# Patient Record
Sex: Male | Born: 1980 | Hispanic: No | Marital: Single | Smoking: Current every day smoker
Health system: Southern US, Community
[De-identification: ages and names within clinical notes are randomized; demographics above are authoritative.]

## PROBLEM LIST (undated history)

## (undated) DIAGNOSIS — S82872A Displaced pilon fracture of left tibia, initial encounter for closed fracture: Secondary | ICD-10-CM

## (undated) DIAGNOSIS — M1990 Unspecified osteoarthritis, unspecified site: Secondary | ICD-10-CM

## (undated) DIAGNOSIS — S82409A Unspecified fracture of shaft of unspecified fibula, initial encounter for closed fracture: Secondary | ICD-10-CM

## (undated) HISTORY — PX: KNEE DISLOCATION SURGERY: SHX689

## (undated) HISTORY — PX: ORIF SHOULDER FRACTURE: SHX5035

## (undated) HISTORY — PX: BLADDER SURGERY: SHX569

---

## 2001-01-20 ENCOUNTER — Emergency Department (HOSPITAL_COMMUNITY): Admission: EM | Admit: 2001-01-20 | Discharge: 2001-01-20 | Payer: Self-pay | Admitting: Emergency Medicine

## 2001-02-07 ENCOUNTER — Emergency Department (HOSPITAL_COMMUNITY): Admission: EM | Admit: 2001-02-07 | Discharge: 2001-02-07 | Payer: Self-pay | Admitting: Emergency Medicine

## 2003-09-08 ENCOUNTER — Inpatient Hospital Stay (HOSPITAL_COMMUNITY): Admission: AC | Admit: 2003-09-08 | Discharge: 2003-09-21 | Payer: Self-pay

## 2003-09-21 ENCOUNTER — Inpatient Hospital Stay (HOSPITAL_COMMUNITY)
Admission: RE | Admit: 2003-09-21 | Discharge: 2003-09-28 | Payer: Self-pay | Admitting: Physical Medicine & Rehabilitation

## 2003-10-24 ENCOUNTER — Ambulatory Visit (HOSPITAL_COMMUNITY): Admission: RE | Admit: 2003-10-24 | Discharge: 2003-10-24 | Payer: Self-pay | Admitting: Specialist

## 2003-11-01 ENCOUNTER — Encounter: Admission: RE | Admit: 2003-11-01 | Discharge: 2004-01-30 | Payer: Self-pay | Admitting: *Deleted

## 2003-11-08 ENCOUNTER — Encounter
Admission: RE | Admit: 2003-11-08 | Discharge: 2004-02-06 | Payer: Self-pay | Admitting: Physical Medicine & Rehabilitation
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
## 2006-06-22 ENCOUNTER — Emergency Department (HOSPITAL_COMMUNITY): Admission: EM | Admit: 2006-06-22 | Discharge: 2006-06-23 | Payer: Self-pay | Admitting: Emergency Medicine

## 2016-04-02 DIAGNOSIS — S82409A Unspecified fracture of shaft of unspecified fibula, initial encounter for closed fracture: Secondary | ICD-10-CM

## 2016-04-02 DIAGNOSIS — S82872A Displaced pilon fracture of left tibia, initial encounter for closed fracture: Secondary | ICD-10-CM

## 2016-04-02 HISTORY — DX: Unspecified fracture of shaft of unspecified fibula, initial encounter for closed fracture: S82.409A

## 2016-04-02 HISTORY — DX: Displaced pilon fracture of left tibia, initial encounter for closed fracture: S82.872A

## 2016-04-30 HISTORY — PX: EXTERNAL FIXATOR APPLICATION: SHX1554

## 2016-05-08 ENCOUNTER — Encounter (HOSPITAL_BASED_OUTPATIENT_CLINIC_OR_DEPARTMENT_OTHER): Payer: Self-pay | Admitting: *Deleted

## 2016-05-08 ENCOUNTER — Other Ambulatory Visit: Payer: Self-pay | Admitting: Orthopedic Surgery

## 2016-05-11 ENCOUNTER — Encounter (HOSPITAL_BASED_OUTPATIENT_CLINIC_OR_DEPARTMENT_OTHER): Payer: Self-pay | Admitting: *Deleted

## 2016-05-14 ENCOUNTER — Ambulatory Visit (HOSPITAL_BASED_OUTPATIENT_CLINIC_OR_DEPARTMENT_OTHER): Payer: No Typology Code available for payment source | Admitting: Certified Registered"

## 2016-05-14 ENCOUNTER — Ambulatory Visit (HOSPITAL_BASED_OUTPATIENT_CLINIC_OR_DEPARTMENT_OTHER)
Admission: RE | Admit: 2016-05-14 | Discharge: 2016-05-14 | Disposition: A | Discharge status: Home / Self Care | Payer: No Typology Code available for payment source | Source: Ambulatory Visit | Attending: Orthopedic Surgery | Admitting: Orthopedic Surgery

## 2016-05-14 ENCOUNTER — Encounter (HOSPITAL_BASED_OUTPATIENT_CLINIC_OR_DEPARTMENT_OTHER): Payer: Self-pay | Admitting: *Deleted

## 2016-05-14 ENCOUNTER — Encounter (HOSPITAL_BASED_OUTPATIENT_CLINIC_OR_DEPARTMENT_OTHER)
Admission: RE | Disposition: A | Discharge status: Home / Self Care | Payer: Self-pay | Source: Ambulatory Visit | Attending: Orthopedic Surgery

## 2016-05-14 DIAGNOSIS — X58XXXA Exposure to other specified factors, initial encounter: Secondary | ICD-10-CM | POA: Insufficient documentation

## 2016-05-14 DIAGNOSIS — S82402A Unspecified fracture of shaft of left fibula, initial encounter for closed fracture: Secondary | ICD-10-CM | POA: Insufficient documentation

## 2016-05-14 DIAGNOSIS — S82872A Displaced pilon fracture of left tibia, initial encounter for closed fracture: Secondary | ICD-10-CM | POA: Insufficient documentation

## 2016-05-14 HISTORY — DX: Unspecified fracture of shaft of unspecified fibula, initial encounter for closed fracture: S82.409A

## 2016-05-14 HISTORY — PX: OPEN REDUCTION INTERNAL FIXATION (ORIF) TIBIA/FIBULA FRACTURE: SHX5992

## 2016-05-14 HISTORY — PX: EXTERNAL FIXATION REMOVAL: SHX5040

## 2016-05-14 HISTORY — DX: Displaced pilon fracture of left tibia, initial encounter for closed fracture: S82.872A

## 2016-05-14 SURGERY — REMOVAL, EXTERNAL FIXATION DEVICE, LOWER EXTREMITY
Anesthesia: Regional | Site: Leg Lower | Laterality: Left

## 2016-05-14 MED ORDER — LACTATED RINGERS IV SOLN
INTRAVENOUS | Status: DC
  Administered 2016-05-14 (×2): via INTRAVENOUS

## 2016-05-14 MED ORDER — FENTANYL CITRATE (PF) 100 MCG/2ML IJ SOLN
50.0000 ug | INTRAMUSCULAR | Status: DC | PRN
  Administered 2016-05-14: 100 ug via INTRAVENOUS

## 2016-05-14 MED ORDER — SCOPOLAMINE 1 MG/3DAYS TD PT72: 1.0000 | MEDICATED_PATCH | Freq: Once | TRANSDERMAL | Status: DC | PRN

## 2016-05-14 MED ORDER — MIDAZOLAM HCL 2 MG/2ML IJ SOLN
INTRAMUSCULAR | Status: AC
  Filled 2016-05-14: qty 2

## 2016-05-14 MED ORDER — PROPOFOL 500 MG/50ML IV EMUL
INTRAVENOUS | Status: AC
  Filled 2016-05-14: qty 50

## 2016-05-14 MED ORDER — ONDANSETRON HCL 4 MG/2ML IJ SOLN: 4.0000 mg | Freq: Four times a day (QID) | INTRAMUSCULAR | Status: DC | PRN

## 2016-05-14 MED ORDER — CEFAZOLIN SODIUM-DEXTROSE 2-4 GM/100ML-% IV SOLN
2.0000 g | INTRAVENOUS | Status: AC
  Administered 2016-05-14: 2 g via INTRAVENOUS

## 2016-05-14 MED ORDER — 0.9 % SODIUM CHLORIDE (POUR BTL) OPTIME
TOPICAL | Status: DC | PRN
  Administered 2016-05-14: 300 mL

## 2016-05-14 MED ORDER — GLYCOPYRROLATE 0.2 MG/ML IJ SOLN: 0.2000 mg | Freq: Once | INTRAMUSCULAR | Status: DC | PRN

## 2016-05-14 MED ORDER — HYDROMORPHONE HCL 1 MG/ML IJ SOLN: 0.2500 mg | INTRAMUSCULAR | Status: DC | PRN

## 2016-05-14 MED ORDER — CEFAZOLIN SODIUM-DEXTROSE 2-4 GM/100ML-% IV SOLN
INTRAVENOUS | Status: AC
  Filled 2016-05-14: qty 100

## 2016-05-14 MED ORDER — ONDANSETRON HCL 4 MG/2ML IJ SOLN
INTRAMUSCULAR | Status: AC
  Filled 2016-05-14: qty 2

## 2016-05-14 MED ORDER — MIDAZOLAM HCL 2 MG/2ML IJ SOLN
1.0000 mg | INTRAMUSCULAR | Status: DC | PRN
  Administered 2016-05-14 (×2): 2 mg via INTRAVENOUS

## 2016-05-14 MED ORDER — ONDANSETRON HCL 4 MG/2ML IJ SOLN
INTRAMUSCULAR | Status: DC | PRN
  Administered 2016-05-14: 4 mg via INTRAVENOUS

## 2016-05-14 MED ORDER — BUPIVACAINE-EPINEPHRINE (PF) 0.5% -1:200000 IJ SOLN
INTRAMUSCULAR | Status: DC | PRN
  Administered 2016-05-14 (×2): 20 mL via PERINEURAL

## 2016-05-14 MED ORDER — FENTANYL CITRATE (PF) 100 MCG/2ML IJ SOLN
INTRAMUSCULAR | Status: AC
  Filled 2016-05-14: qty 2

## 2016-05-14 MED ORDER — LIDOCAINE 2% (20 MG/ML) 5 ML SYRINGE
INTRAMUSCULAR | Status: AC
  Filled 2016-05-14: qty 5

## 2016-05-14 MED ORDER — NAPROXEN SODIUM 220 MG PO TABS: 440.0000 mg | ORAL_TABLET | Freq: Two times a day (BID) | ORAL | Status: DC

## 2016-05-14 MED ORDER — PROPOFOL 10 MG/ML IV BOLUS
INTRAVENOUS | Status: DC | PRN
  Administered 2016-05-14: 50 mg via INTRAVENOUS
  Administered 2016-05-14: 200 mg via INTRAVENOUS
  Administered 2016-05-14: 30 mg via INTRAVENOUS

## 2016-05-14 MED ORDER — OXYCODONE HCL 5 MG/5ML PO SOLN: 5.0000 mg | Freq: Once | ORAL | Status: DC | PRN

## 2016-05-14 MED ORDER — CHLORHEXIDINE GLUCONATE 4 % EX LIQD: 60.0000 mL | Freq: Once | CUTANEOUS | Status: DC

## 2016-05-14 MED ORDER — HYDROMORPHONE HCL 2 MG PO TABS: 2.0000 mg | ORAL_TABLET | ORAL | Status: DC | PRN

## 2016-05-14 MED ORDER — DEXAMETHASONE SODIUM PHOSPHATE 10 MG/ML IJ SOLN
INTRAMUSCULAR | Status: DC | PRN
  Administered 2016-05-14: 10 mg via INTRAVENOUS

## 2016-05-14 MED ORDER — OXYCODONE HCL 5 MG PO TABS: 5.0000 mg | ORAL_TABLET | Freq: Once | ORAL | Status: DC | PRN

## 2016-05-14 MED ORDER — DEXAMETHASONE SODIUM PHOSPHATE 10 MG/ML IJ SOLN
INTRAMUSCULAR | Status: AC
  Filled 2016-05-14: qty 1

## 2016-05-14 MED ORDER — SODIUM CHLORIDE 0.9 % IV SOLN: INTRAVENOUS | Status: DC

## 2016-05-14 SURGICAL SUPPLY — 87 items
BAG DECANTER FOR FLEXI CONT (MISCELLANEOUS) IMPLANT
BANDAGE ESMARK 6X9 LF (GAUZE/BANDAGES/DRESSINGS) ×1 IMPLANT
BIT DRILL 2.5X2.75 QC CALB (BIT) ×2 IMPLANT
BIT DRILL 3.5X5.5 QC CALB (BIT) ×2 IMPLANT
BIT DRILL CALIBRATED 2.7 (BIT) ×2 IMPLANT
BLADE SURG 15 STRL LF DISP TIS (BLADE) ×4 IMPLANT
BLADE SURG 15 STRL SS (BLADE) ×4
BNDG COHESIVE 4X5 TAN STRL (GAUZE/BANDAGES/DRESSINGS) ×2 IMPLANT
BNDG COHESIVE 6X5 TAN STRL LF (GAUZE/BANDAGES/DRESSINGS) ×2 IMPLANT
BNDG ESMARK 6X9 LF (GAUZE/BANDAGES/DRESSINGS) ×2
BRUSH SCRUB EZ PLAIN DRY (MISCELLANEOUS) ×2 IMPLANT
CHLORAPREP W/TINT 26ML (MISCELLANEOUS) ×2 IMPLANT
COVER BACK TABLE 60X90IN (DRAPES) ×2 IMPLANT
CUFF TOURNIQUET SINGLE 34IN LL (TOURNIQUET CUFF) ×2 IMPLANT
DECANTER SPIKE VIAL GLASS SM (MISCELLANEOUS) IMPLANT
DRAPE EXTREMITY T 121X128X90 (DRAPE) ×2 IMPLANT
DRAPE OEC MINIVIEW 54X84 (DRAPES) ×2 IMPLANT
DRAPE U-SHAPE 47X51 STRL (DRAPES) ×2 IMPLANT
DRILL SLEEVE 2.7 DIST TIB (TRAUMA) ×1
DRSG MEPITEL 4X7.2 (GAUZE/BANDAGES/DRESSINGS) ×2 IMPLANT
DRSG PAD ABDOMINAL 8X10 ST (GAUZE/BANDAGES/DRESSINGS) ×4 IMPLANT
ELECT REM PT RETURN 9FT ADLT (ELECTROSURGICAL) ×2
ELECTRODE REM PT RTRN 9FT ADLT (ELECTROSURGICAL) ×1 IMPLANT
GAUZE SPONGE 4X4 12PLY STRL (GAUZE/BANDAGES/DRESSINGS) ×2 IMPLANT
GAUZE XEROFORM 1X8 LF (GAUZE/BANDAGES/DRESSINGS) IMPLANT
GLOVE BIO SURGEON STRL SZ8 (GLOVE) ×2 IMPLANT
GLOVE BIOGEL PI IND STRL 7.0 (GLOVE) ×2 IMPLANT
GLOVE BIOGEL PI IND STRL 8 (GLOVE) ×2 IMPLANT
GLOVE BIOGEL PI INDICATOR 7.0 (GLOVE) ×2
GLOVE BIOGEL PI INDICATOR 8 (GLOVE) ×2
GLOVE ECLIPSE 6.5 STRL STRAW (GLOVE) ×2 IMPLANT
GLOVE ECLIPSE 7.5 STRL STRAW (GLOVE) ×2 IMPLANT
GLOVE EXAM NITRILE MD LF STRL (GLOVE) IMPLANT
GOWN STRL REUS W/ TWL LRG LVL3 (GOWN DISPOSABLE) ×1 IMPLANT
GOWN STRL REUS W/ TWL XL LVL3 (GOWN DISPOSABLE) ×2 IMPLANT
GOWN STRL REUS W/TWL LRG LVL3 (GOWN DISPOSABLE) ×1
GOWN STRL REUS W/TWL XL LVL3 (GOWN DISPOSABLE) ×2
K-WIRE ACE 1.6X6 (WIRE) ×4
KWIRE ACE 1.6X6 (WIRE) ×2 IMPLANT
NEEDLE HYPO 22GX1.5 SAFETY (NEEDLE) ×2 IMPLANT
NS IRRIG 1000ML POUR BTL (IV SOLUTION) ×2 IMPLANT
PACK BASIN DAY SURGERY FS (CUSTOM PROCEDURE TRAY) ×2 IMPLANT
PAD CAST 4YDX4 CTTN HI CHSV (CAST SUPPLIES) ×1 IMPLANT
PADDING CAST ABS 4INX4YD NS (CAST SUPPLIES)
PADDING CAST ABS COTTON 4X4 ST (CAST SUPPLIES) IMPLANT
PADDING CAST COTTON 4X4 STRL (CAST SUPPLIES) ×1
PADDING CAST COTTON 6X4 STRL (CAST SUPPLIES) ×2 IMPLANT
PENCIL BUTTON HOLSTER BLD 10FT (ELECTRODE) ×2 IMPLANT
PLATE 9H 184 LT MED DIST TIB (Plate) ×2 IMPLANT
PLATE ACE 100DEG 7HOLE (Plate) ×2 IMPLANT
SANITIZER HAND PURELL 535ML FO (MISCELLANEOUS) ×2 IMPLANT
SCREW CORT FT 32X3.5XNONLOCK (Screw) ×2 IMPLANT
SCREW CORTICAL 3.5MM  16MM (Screw) ×2 IMPLANT
SCREW CORTICAL 3.5MM  30MM (Screw) ×3 IMPLANT
SCREW CORTICAL 3.5MM  32MM (Screw) ×2 IMPLANT
SCREW CORTICAL 3.5MM 14MM (Screw) ×8 IMPLANT
SCREW CORTICAL 3.5MM 16MM (Screw) ×2 IMPLANT
SCREW CORTICAL 3.5MM 18MM (Screw) ×2 IMPLANT
SCREW CORTICAL 3.5MM 30MM (Screw) ×3 IMPLANT
SCREW LOCK CORT STAR 3.5X40 (Screw) ×4 IMPLANT
SCREW LOCK CORT STAR 3.5X46 (Screw) ×4 IMPLANT
SCREW LP 3.5 (Screw) ×2 IMPLANT
SHEET MEDIUM DRAPE 40X70 STRL (DRAPES) ×2 IMPLANT
SLEEVE DRILL 2.7 DIST TIB (TRAUMA) ×1 IMPLANT
SLEEVE SCD COMPRESS KNEE MED (MISCELLANEOUS) ×2 IMPLANT
SOAP 2% CHG 32OZ (WOUND CARE) IMPLANT
SOL PREP POV-IOD 16OZ 10% (MISCELLANEOUS) IMPLANT
SPLINT FAST PLASTER 5X30 (CAST SUPPLIES) ×20
SPLINT PLASTER CAST FAST 5X30 (CAST SUPPLIES) ×20 IMPLANT
SPONGE LAP 18X18 X RAY DECT (DISPOSABLE) ×2 IMPLANT
STOCKINETTE 6  STRL (DRAPES) ×1
STOCKINETTE 6 STRL (DRAPES) ×1 IMPLANT
SUCTION FRAZIER HANDLE 10FR (MISCELLANEOUS) ×1
SUCTION TUBE FRAZIER 10FR DISP (MISCELLANEOUS) ×1 IMPLANT
SURGILUBE 2OZ TUBE FLIPTOP (MISCELLANEOUS) IMPLANT
SUT ETHILON 3 0 PS 1 (SUTURE) ×4 IMPLANT
SUT MNCRL AB 3-0 PS2 18 (SUTURE) ×2 IMPLANT
SUT VIC AB 0 SH 27 (SUTURE) IMPLANT
SUT VIC AB 2-0 SH 27 (SUTURE) ×1
SUT VIC AB 2-0 SH 27XBRD (SUTURE) ×1 IMPLANT
SYR BULB 3OZ (MISCELLANEOUS) ×2 IMPLANT
SYR CONTROL 10ML LL (SYRINGE) ×2 IMPLANT
TAP CORT 3.5MM SOLID DISP (TRAUMA) ×2 IMPLANT
TOWEL OR 17X24 6PK STRL BLUE (TOWEL DISPOSABLE) ×4 IMPLANT
TRAY DSU PREP LF (CUSTOM PROCEDURE TRAY) ×2 IMPLANT
TUBE CONNECTING 20X1/4 (TUBING) ×2 IMPLANT
UNDERPAD 30X30 (UNDERPADS AND DIAPERS) ×2 IMPLANT

## 2016-05-14 NOTE — Anesthesia Postprocedure Evaluation (Signed)
Anesthesia Post Note  Patient: Dustin Brooks  Procedure(s) Performed: Procedure(s) (LRB): REMOVAL OF EXTERNAL FIXATOR (Left) OPEN REDUCTION INTERNAL FIXATION LEFT FIBULA AND TIBIAL PILON FRACTURE (ORIF) (Left)  Patient location during evaluation: PACU Anesthesia Type: General Level of consciousness: awake and alert and patient cooperative Pain management: pain level controlled Vital Signs Assessment: post-procedure vital signs reviewed and stable Respiratory status: spontaneous breathing and respiratory function stable Cardiovascular status: stable Anesthetic complications: no    Last Vitals:  Filed Vitals:   05/14/16 1345 05/14/16 1409  BP: 109/74 113/65  Pulse: 84 78  Temp:  37 C  Resp: 11 16    Last Pain:  Filed Vitals:   05/14/16 1411  PainSc: 0-No pain                 Sorrel Cassetta S
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
## 2016-05-14 NOTE — Brief Op Note (Signed)
05/14/2016  12:49 PM  PATIENT:  Dustin Brooks  35 y.o. male  PRE-OPERATIVE DIAGNOSIS:  LEFT TIBIAL PILON AND FIBULA FRACTURE s/p closed reduction and external fixation  POST-OPERATIVE DIAGNOSIS:  Same  Procedure(s): 1.  REMOVAL OF EXTERNAL FIXATOR under anesthesia 2.  OPEN REDUCTION INTERNAL FIXATION LEFT FIBULA AND TIBIAL PILON FRACTUREs 3.  AP, mortise and lateral xrays of the left ankle  SURGEON:  Toni ArthursJohn Theda Payer, MD  ASSISTANT: Alfredo MartinezJustin Ollis, PA-C  ANESTHESIA:   General, regional  EBL:  minimal   TOURNIQUET:  84 min at 250 mm Hg  COMPLICATIONS:  None apparent  DISPOSITION:  Extubated, awake and stable to recovery.  DICTATION ID:

## 2016-05-14 NOTE — Anesthesia Preprocedure Evaluation (Signed)
Anesthesia Evaluation  Patient identified by MRN, date of birth, ID band Patient awake    Reviewed: Allergy & Precautions, NPO status , Patient's Chart, lab work & pertinent test results  Airway Mallampati: II   Neck ROM: full    Dental   Pulmonary neg pulmonary ROS,    breath sounds clear to auscultation       Cardiovascular negative cardio ROS   Rhythm:regular Rate:Normal     Neuro/Psych    GI/Hepatic   Endo/Other    Renal/GU      Musculoskeletal   Abdominal   Peds  Hematology   Anesthesia Other Findings   Reproductive/Obstetrics                             Anesthesia Physical Anesthesia Plan  ASA: I  Anesthesia Plan: General and Regional   Post-op Pain Management:  Regional for Post-op pain   Induction: Intravenous  Airway Management Planned: LMA  Additional Equipment:   Intra-op Plan:   Post-operative Plan:   Informed Consent: I have reviewed the patients History and Physical, chart, labs and discussed the procedure including the risks, benefits and alternatives for the proposed anesthesia with the patient or authorized representative who has indicated his/her understanding and acceptance.     Plan Discussed with: CRNA, Anesthesiologist and Surgeon  Anesthesia Plan Comments:         Anesthesia Quick Evaluation

## 2016-05-14 NOTE — Transfer of Care (Signed)
Immediate Anesthesia Transfer of Care Note  Patient: Dustin Brooks  Procedure(s) Performed: Procedure(s): REMOVAL OF EXTERNAL FIXATOR (Left) OPEN REDUCTION INTERNAL FIXATION LEFT FIBULA AND TIBIAL PILON FRACTURE (ORIF) (Left)  Patient Location: PACU  Anesthesia Type:GA combined with regional for post-op pain  Level of Consciousness: awake and patient cooperative  Airway & Oxygen Therapy: Patient Spontanous Breathing and Patient connected to face mask oxygen  Post-op Assessment: Report given to RN and Post -op Vital signs reviewed and stable  Post vital signs: Reviewed and stable  Last Vitals:  Filed Vitals:   05/14/16 1013 05/14/16 1014  BP:    Pulse: 77 67  Temp:    Resp: 15 14    Last Pain:  Filed Vitals:   05/14/16 1014  PainSc: 6          Complications: No apparent anesthesia complications

## 2016-05-14 NOTE — Anesthesia Procedure Notes (Addendum)
Anesthesia Regional Block:  Popliteal block  Pre-Anesthetic Checklist: ,, timeout performed, Correct Patient, Correct Site, Correct Laterality, Correct Procedure, Correct Position, site marked, Risks and benefits discussed,  Surgical consent,  Pre-op evaluation,  At surgeon's request and post-op pain management  Laterality: Left  Prep: chloraprep       Needles:  Injection technique: Single-shot  Needle Type: Echogenic Stimulator Needle     Needle Length: 9cm 9 cm Needle Gauge: 21 and 21 G    Additional Needles:  Procedures: ultrasound guided (picture in chart) and nerve stimulator Popliteal block  Nerve Stimulator or Paresthesia:  Response: plantar flexion of foot, 0.45 mA,   Additional Responses:   Narrative:  Start time: 05/14/2016 9:45 AM End time: 05/14/2016 9:56 AM Injection made incrementally with aspirations every 5 mL.  Performed by: Personally  Anesthesiologist: HODIERNE, ADAM  Additional Notes: Functioning IV was confirmed and monitors were applied.  A 90mm 21ga Arrow echogenic stimulator needle was used. Sterile prep and drape,hand hygiene and sterile gloves were used.  Negative aspiration and negative test dose prior to incremental administration of local anesthetic (20 mL 0.5% bupivacaine +epi). The patient tolerated the procedure well.  Ultrasound guidance: relevent anatomy identified, needle position confirmed, local anesthetic spread visualized around nerve(s), vascular puncture avoided.  Image printed for medical record.   Left side adductor canal block also performed.  Sterile technique.  Ultrasound guidance.  20 mL of 0.5% bupivacaine +epi was injected at this site.   Procedure Name: LMA Insertion Date/Time: 05/14/2016 10:38 AM Performed by: Janeal Abadi D Pre-anesthesia Checklist: Patient identified, Emergency Drugs available, Suction available and Patient being monitored Patient Re-evaluated:Patient Re-evaluated prior to inductionOxygen Delivery  Method: Circle system utilized Preoxygenation: Pre-oxygenation with 100% oxygen Intubation Type: IV induction Ventilation: Mask ventilation without difficulty LMA: LMA inserted LMA Size: 5.0 Number of attempts: 1 Airway Equipment and Method: Bite block Placement Confirmation: positive ETCO2 Tube secured with: Tape Dental Injury: Teeth and Oropharynx as per pre-operative assessment

## 2016-05-14 NOTE — Discharge Instructions (Addendum)
Toni ArthursJohn Hewitt, MD [facility]  Please read the following information regarding your care after surgery.  Medications  You only need a prescription for the narcotic pain medicine (ex. oxycodone, Percocet, Norco).  All of the other medicines listed below are available over the counter. X acetominophen (Tylenol) 650 mg every 4-6 hours as you need for minor pain X Dilaudid as prescribed for moderate to severe pain X Aleve for the first three days after surgery   Narcotic pain medicine (ex. oxycodone, Percocet, Vicodin) will cause constipation.  To prevent this problem, take the following medicines while you are taking any pain medicine. X docusate sodium (Colace) 100 mg twice a day X senna (Senokot) 2 tablets twice a day  X To help prevent blood clots, take a baby aspirin (81 mg) twice a day for two weeks after surgery.  You should also get up every hour while you are awake to move around.    Weight Bearing ? Bear weight when you are able on your operated leg or foot. ? Bear weight only on the heel of your operated foot in the post-op shoe. X Do not bear any weight on the operated leg or foot.  Cast / Splint / Dressing X Keep your splint or cast clean and dry.  Dont put anything (coat hanger, pencil, etc) down inside of it.  If it gets damp, use a hair dryer on the cool setting to dry it.  If it gets soaked, call the office to schedule an appointment for a cast change. ? Remove your dressing 3 days after surgery and cover the incisions with dry dressings.    After your dressing, cast or splint is removed; you may shower, but do not soak or scrub the wound.  Allow the water to run over it, and then gently pat it dry.  Swelling It is normal for you to have swelling where you had surgery.  To reduce swelling and pain, keep your toes above your nose for at least 3 days after surgery.  It may be necessary to keep your foot or leg elevated for several weeks.  If it hurts, it should be  elevated.  Follow Up Call my office at [phone] when you are discharged from the hospital or surgery center to schedule an appointment to be seen two weeks after surgery.  Call my office at [phone] if you develop a fever >101.5 F, nausea, vomiting, bleeding from the surgical site or severe pain.     Post Anesthesia Home Care Instructions  Activity: Get plenty of rest for the remainder of the day. A responsible adult should stay with you for 24 hours following the procedure.  For the next 24 hours, DO NOT: -Drive a car -Advertising copywriterperate machinery -Drink alcoholic beverages -Take any medication unless instructed by your physician -Make any legal decisions or sign important papers.  Meals: Start with liquid foods such as gelatin or soup. Progress to regular foods as tolerated. Avoid greasy, spicy, heavy foods. If nausea and/or vomiting occur, drink only clear liquids until the nausea and/or vomiting subsides. Call your physician if vomiting continues.  Special Instructions/Symptoms: Your throat may feel dry or sore from the anesthesia or the breathing tube placed in your throat during surgery. If this causes discomfort, gargle with warm salt water. The discomfort should disappear within 24 hours.  If you had a scopolamine patch placed behind your ear for the management of post- operative nausea and/or vomiting:  1. The medication in the patch is effective  for 72 hours, after which it should be removed.  Wrap patch in a tissue and discard in the trash. Wash hands thoroughly with soap and water. 2. You may remove the patch earlier than 72 hours if you experience unpleasant side effects which may include dry mouth, dizziness or visual disturbances. 3. Avoid touching the patch. Wash your hands with soap and water after contact with the patch.   Regional Anesthesia Blocks  1. Numbness or the inability to move the "blocked" extremity may last from 3-48 hours after placement. The length of  time depends on the medication injected and your individual response to the medication. If the numbness is not going away after 48 hours, call your surgeon.  2. The extremity that is blocked will need to be protected until the numbness is gone and the  Strength has returned. Because you cannot feel it, you will need to take extra care to avoid injury. Because it may be weak, you may have difficulty moving it or using it. You may not know what position it is in without looking at it while the block is in effect.  3. For blocks in the legs and feet, returning to weight bearing and walking needs to be done carefully. You will need to wait until the numbness is entirely gone and the strength has returned. You should be able to move your leg and foot normally before you try and bear weight or walk. You will need someone to be with you when you first try to ensure you do not fall and possibly risk injury.  4. Bruising and tenderness at the needle site are common side effects and will resolve in a few days.  5. Persistent numbness or new problems with movement should be communicated to the surgeon or the [facility] [phone] [facility] [phone]).

## 2016-05-14 NOTE — Progress Notes (Signed)
Assisted Dr. Chaney MallingHodierne with left, ultrasound guided, popliteal/saphenous block. Side rails up, monitors on throughout procedure. See vital signs in flow sheet. Patient has extreme needle phobia. Meds given per Dr Hodierne's order. Suann LarryJanet Craft, CRNA here and administered diprivan see record. Maintained natural airway and had saturations of 100 percent throughout.

## 2016-05-15 NOTE — Op Note (Signed)
NAME:  Dustin Brooks, Dustin Brooks    ACCOUNT NO.:  [id number]  MEDICAL RECORD NO.:  [id number]  LOCATION:                                 FACILITY:  PHYSICIAN:  Toni ArthursJohn Avyukth Bontempo, MD        DATE OF BIRTH:  [birth year]  DATE OF PROCEDURE:  05/14/2016 DATE OF DISCHARGE:                              OPERATIVE REPORT   POSTOPERATIVE DIAGNOSES:  Left tibial pilon and fibular fractures, status post closed reduction and external fixation.  POSTOPERATIVE DIAGNOSIS:  Left tibial pilon and fibular fractures, status post closed reduction and external fixation.  PROCEDURE: 1. Removal of external fixator from the left lower extremity under     anesthesia. 2. Open reduction and internal fixation of left tibial pilon and     fibular fractures. 3. AP, mortise, and lateral radiographs of the left ankle.  SURGEON:  Toni ArthursJohn Itxel Wickard, MD  ASSISTANT:  Alfredo MartinezJustin Ollis, PA-C  ANESTHESIA:  General, regional.  ESTIMATED BLOOD LOSS:  Minimal.  TOURNIQUET TIME:  84 minutes at 250 mmHg.  COMPLICATIONS:  None apparent.  DISPOSITION:  Extubated, awake, and stable to recovery.  INDICATIONS FOR PROCEDURE:  The patient is a 35 year old male who injured his left ankle while [place]ing in [place] approximately 10 days ago.  He sustained a tibial pilon and fibular fracture.  He underwent closed reduction and application of an external fixator at that time. [place] [facility] and presents now for removal of the external fixator and ORIF of the tibial pilon fracture.  He understands the risks and benefits, the alternative treatment options, and elects surgical treatment.  He specifically understands risks of bleeding, infection, nerve damage, blood clots, need for additional surgery, continued pain, nonunion, amputation, and death.  PROCEDURE IN DETAIL:  After preoperative consent was obtained and the correct operative site was identified, the patient was brought to the operating room and placed supine on the  operating table.  General anesthesia was induced.  Preoperative antibiotics were administered. Surgical time-out was taken.  Left lower extremity external fixator was then identified.  It was removed in its entirety by removing all of the clamps and then the bars.  Finally, the Steinmann pins were removed from the heel and the tibia.  The left lower extremity was then prepped and draped in standard sterile fashion with tourniquet around the thigh.  A lateral incision was made over the fibula.  Sharp dissection was carried down through the skin and subcutaneous tissue.  The fracture site was identified.  It was irrigated and cleaned of all hematoma.  It was then reduced and fixed with a 3.5 mm fully-threaded lag screw from the Biomet small frag set.  A 7-hole one-third tubular plate was then applied across the fracture site and fixed proximally and distally with 3 bicortical screws on each side.  AP and lateral radiographs confirmed appropriate reduction of the fracture and appropriate position length of all the screws.  Wound was irrigated and closed with Monocryl and nylon.  Attention was then turned to the medial aspect of the ankle, where a longitudinal incision was made over the medial malleolus.  Blunt dissection was then carried in a submuscular fashion up against the medial tibial bone.  A 9-hole Biomet ALPS  tibial plate was selected.  It was inserted using the inserter handle again in submuscular fashion.  It was pinned distally.  Radiographs confirmed appropriate alignment of the plate.  A small incision was then made anteriorly at the fracture site. The wound was irrigated, and the hematoma was removed from the fracture site.  Fracture was reduced and held provisionally with lobster claw clamps.  A 3.5 mm fully-threaded lag screw was then inserted through the plate and across the primary fracture line.  It was noted to have excellent purchase and compressed the fracture site  appropriately.  Attention was turned to the distal end of the plate.  AP and lateral radiographs confirmed appropriate position of the plate.  The plate was then secured to bone with a 3.5 mm nonlocking screw.  Four more locking screws were inserted at the level of the tibial pilon.  These were inserted after a tenaculum was used to compress the sagittal plane split at the articular surface.  AP, mortise, and lateral radiographs confirmed appropriate alignment of the screws and appropriate reduction of the tibial pilon fracture.  Attention was then turned to the fracture site, where a second screw was placed across the fracture site.  This time, it was a position screw. Finally, percutaneous technique was used to insert 3 bicortical screws proximal to the fracture site at the proximal end of the plate.  These were nonlocking 3.5 mm fully-threaded screws inserted after drilling and tapping the holes.  Final AP, mortise, and lateral radiographs showed appropriate position length of all hardware and appropriate alignment of the fractures.  Wounds were all irrigated copiously.  The skin incisions were all closed with Monocryl and nylon.  Sterile dressings were applied followed by a well-padded short-leg splint.  Tourniquet was released after application of the dressings at 84 minutes.  The patient was awakened from anesthesia and transported to the recovery room in stable condition.  FOLLOWUP PLAN:  The patient will be nonweightbearing on the left lower extremity.  He will take aspirin for DVT prophylaxis.  He will follow up with me in the office in 2 weeks for suture removal and conversion to a short-leg cast.  RADIOGRAPHS:  AP, mortise, and lateral radiographs of the left ankle were obtained intraoperatively.  These show interval reduction and fixation of the tibial pilon and fibular fractures.  No other acute injuries are noted.  Alfredo Martinez, PA-C, was present and scrubbed for the  duration of the case.  His assistance was essential in positioning the patient, prepping and draping, gaining and maintaining exposure, performing the operation, closing and dressing the wounds, and applying the splint.     Toni Arthurs, MD   ______________________________ Toni Arthurs, MD    JH/MEDQ  D:  05/14/2016  T:  05/14/2016  Job:  [id number]

## 2016-05-17 ENCOUNTER — Encounter (HOSPITAL_BASED_OUTPATIENT_CLINIC_OR_DEPARTMENT_OTHER): Payer: Self-pay | Admitting: Orthopedic Surgery

## 2016-12-01 DIAGNOSIS — Y999 Unspecified external cause status: Secondary | ICD-10-CM | POA: Diagnosis not present

## 2016-12-01 DIAGNOSIS — M23321 Other meniscus derangements, posterior horn of medial meniscus, right knee: Secondary | ICD-10-CM | POA: Diagnosis not present

## 2016-12-01 DIAGNOSIS — S83231A Complex tear of medial meniscus, current injury, right knee, initial encounter: Secondary | ICD-10-CM | POA: Diagnosis not present

## 2016-12-01 DIAGNOSIS — M1711 Unilateral primary osteoarthritis, right knee: Secondary | ICD-10-CM | POA: Diagnosis not present

## 2016-12-01 DIAGNOSIS — M94261 Chondromalacia, right knee: Secondary | ICD-10-CM | POA: Diagnosis not present

## 2016-12-08 DIAGNOSIS — M25561 Pain in right knee: Secondary | ICD-10-CM | POA: Diagnosis not present

## 2016-12-11 DIAGNOSIS — M25561 Pain in right knee: Secondary | ICD-10-CM | POA: Diagnosis not present

## 2016-12-14 DIAGNOSIS — M5412 Radiculopathy, cervical region: Secondary | ICD-10-CM | POA: Diagnosis not present

## 2016-12-14 DIAGNOSIS — M545 Low back pain: Secondary | ICD-10-CM | POA: Diagnosis not present

## 2016-12-18 DIAGNOSIS — M25561 Pain in right knee: Secondary | ICD-10-CM | POA: Diagnosis not present

## 2016-12-22 DIAGNOSIS — M5412 Radiculopathy, cervical region: Secondary | ICD-10-CM | POA: Diagnosis not present

## 2016-12-28 DIAGNOSIS — M25561 Pain in right knee: Secondary | ICD-10-CM | POA: Diagnosis not present

## 2017-01-06 DIAGNOSIS — M5412 Radiculopathy, cervical region: Secondary | ICD-10-CM | POA: Diagnosis not present

## 2017-01-08 DIAGNOSIS — M5412 Radiculopathy, cervical region: Secondary | ICD-10-CM | POA: Diagnosis not present

## 2017-01-11 DIAGNOSIS — M5412 Radiculopathy, cervical region: Secondary | ICD-10-CM | POA: Diagnosis not present

## 2017-01-14 DIAGNOSIS — M5412 Radiculopathy, cervical region: Secondary | ICD-10-CM | POA: Diagnosis not present

## 2017-01-22 DIAGNOSIS — M5412 Radiculopathy, cervical region: Secondary | ICD-10-CM | POA: Diagnosis not present

## 2017-01-22 DIAGNOSIS — M545 Low back pain: Secondary | ICD-10-CM | POA: Diagnosis not present

## 2017-01-25 DIAGNOSIS — M545 Low back pain: Secondary | ICD-10-CM | POA: Diagnosis not present

## 2017-01-25 DIAGNOSIS — M5412 Radiculopathy, cervical region: Secondary | ICD-10-CM | POA: Diagnosis not present

## 2017-01-25 DIAGNOSIS — M50822 Other cervical disc disorders at C5-C6 level: Secondary | ICD-10-CM | POA: Diagnosis not present

## 2017-01-25 DIAGNOSIS — M50821 Other cervical disc disorders at C4-C5 level: Secondary | ICD-10-CM | POA: Diagnosis not present

## 2017-02-03 DIAGNOSIS — M5412 Radiculopathy, cervical region: Secondary | ICD-10-CM | POA: Diagnosis not present

## 2017-02-03 DIAGNOSIS — M50821 Other cervical disc disorders at C4-C5 level: Secondary | ICD-10-CM | POA: Diagnosis not present

## 2017-02-03 DIAGNOSIS — M50822 Other cervical disc disorders at C5-C6 level: Secondary | ICD-10-CM | POA: Diagnosis not present

## 2017-02-08 ENCOUNTER — Other Ambulatory Visit: Payer: Self-pay | Admitting: Orthopedic Surgery

## 2017-02-11 ENCOUNTER — Ambulatory Visit: Payer: Self-pay | Admitting: Physician Assistant

## 2017-02-16 ENCOUNTER — Inpatient Hospital Stay (HOSPITAL_COMMUNITY)
Admission: RE | Admit: 2017-02-16 | Discharge: 2017-02-16 | Disposition: A | Discharge status: Home / Self Care | Payer: Self-pay | Source: Ambulatory Visit

## 2017-02-16 NOTE — Pre-Procedure Instructions (Addendum)
    Dunbar Renaldo Gornick  02/16/2017      Your procedure is scheduled on Thursday, April 19.  Report to [facility] Admitting at 9:00A.M.              Your surgery or procedure is scheduled for 11:00 AM   Call this number if you have problems the morning of surgery: [phone]                 For any other questions, please call [phone], Monday - Friday 8 AM - 4 PM.   Remember:  Do not eat food or drink liquids after midnight Wednesday, April 18.  Take these medicines the morning of surgery with A SIP OF WATER :  STOP ASPIRIN, HERBAL MEDICATIONS, NSAIDS-aleve,advil,ibuprofen as of today.   Do not wear jewelry, make-up or nail polish.  Do not wear lotions, powders, or perfumes, or deodorant.  Do not shave 48 hours prior to surgery.  Men may shave face and neck.  Do not bring valuables to the hospital.  [facility] is not responsible for any belongings or valuables.  Contacts, dentures or bridgework may not be worn into surgery.  Leave your suitcase in the car.  After surgery it may be brought to your room.  For patients admitted to the hospital, discharge time will be determined by your treatment team.  Patients discharged the day of surgery will not be allowed to drive home.   Special instructions:  Review  [place] - Preparing For Surgery.  Please read over the following fact sheets that you were given: [place]- Preparing For Surgery, Coughing and Deep Breathingh, Pain Booklet, Surgical Site Infections

## 2017-02-16 NOTE — Progress Notes (Signed)
Pt reports his surgery has been canceled, will notify [facility] in scheduling

## 2017-02-18 ENCOUNTER — Ambulatory Visit (HOSPITAL_COMMUNITY)
Admission: RE | Admit: 2017-02-18 | Payer: BLUE CROSS/BLUE SHIELD | Source: Ambulatory Visit | Admitting: Orthopedic Surgery

## 2017-02-18 ENCOUNTER — Encounter (HOSPITAL_COMMUNITY): Admission: RE | Payer: Self-pay | Source: Ambulatory Visit

## 2017-02-18 SURGERY — ANTERIOR CERVICAL DECOMPRESSION/DISCECTOMY FUSION 1 LEVEL
Anesthesia: General

## 2017-03-03 DIAGNOSIS — M1711 Unilateral primary osteoarthritis, right knee: Secondary | ICD-10-CM | POA: Diagnosis not present

## 2017-03-17 DIAGNOSIS — M1711 Unilateral primary osteoarthritis, right knee: Secondary | ICD-10-CM | POA: Diagnosis not present

## 2017-03-18 DIAGNOSIS — Z01818 Encounter for other preprocedural examination: Secondary | ICD-10-CM | POA: Diagnosis not present

## 2017-03-18 DIAGNOSIS — Z0000 Encounter for general adult medical examination without abnormal findings: Secondary | ICD-10-CM | POA: Diagnosis not present

## 2017-05-03 DIAGNOSIS — M1711 Unilateral primary osteoarthritis, right knee: Secondary | ICD-10-CM | POA: Diagnosis not present

## 2017-05-03 DIAGNOSIS — Z4789 Encounter for other orthopedic aftercare: Secondary | ICD-10-CM | POA: Diagnosis not present

## 2017-05-03 DIAGNOSIS — S83241D Other tear of medial meniscus, current injury, right knee, subsequent encounter: Secondary | ICD-10-CM | POA: Diagnosis not present

## 2017-05-10 DIAGNOSIS — M50821 Other cervical disc disorders at C4-C5 level: Secondary | ICD-10-CM | POA: Diagnosis not present

## 2017-05-10 DIAGNOSIS — M5412 Radiculopathy, cervical region: Secondary | ICD-10-CM | POA: Diagnosis not present

## 2017-05-10 DIAGNOSIS — M50822 Other cervical disc disorders at C5-C6 level: Secondary | ICD-10-CM | POA: Diagnosis not present

## 2017-05-10 DIAGNOSIS — M545 Low back pain: Secondary | ICD-10-CM | POA: Diagnosis not present

## 2017-05-19 DIAGNOSIS — M5412 Radiculopathy, cervical region: Secondary | ICD-10-CM | POA: Diagnosis not present

## 2017-05-19 DIAGNOSIS — M50822 Other cervical disc disorders at C5-C6 level: Secondary | ICD-10-CM | POA: Diagnosis not present

## 2017-05-19 DIAGNOSIS — M545 Low back pain: Secondary | ICD-10-CM | POA: Diagnosis not present

## 2017-06-19 DIAGNOSIS — M5412 Radiculopathy, cervical region: Secondary | ICD-10-CM | POA: Diagnosis not present

## 2017-06-19 DIAGNOSIS — M545 Low back pain: Secondary | ICD-10-CM | POA: Diagnosis not present

## 2017-06-19 DIAGNOSIS — M50822 Other cervical disc disorders at C5-C6 level: Secondary | ICD-10-CM | POA: Diagnosis not present

## 2017-07-20 DIAGNOSIS — M5412 Radiculopathy, cervical region: Secondary | ICD-10-CM | POA: Diagnosis not present

## 2017-07-20 DIAGNOSIS — M545 Low back pain: Secondary | ICD-10-CM | POA: Diagnosis not present

## 2017-07-20 DIAGNOSIS — M50822 Other cervical disc disorders at C5-C6 level: Secondary | ICD-10-CM | POA: Diagnosis not present

## 2017-08-19 DIAGNOSIS — M50822 Other cervical disc disorders at C5-C6 level: Secondary | ICD-10-CM | POA: Diagnosis not present

## 2017-08-19 DIAGNOSIS — M545 Low back pain: Secondary | ICD-10-CM | POA: Diagnosis not present

## 2017-08-19 DIAGNOSIS — M5412 Radiculopathy, cervical region: Secondary | ICD-10-CM | POA: Diagnosis not present

## 2017-09-17 DIAGNOSIS — D225 Melanocytic nevi of trunk: Secondary | ICD-10-CM | POA: Diagnosis not present

## 2017-09-17 DIAGNOSIS — D485 Neoplasm of uncertain behavior of skin: Secondary | ICD-10-CM | POA: Diagnosis not present

## 2018-05-02 DIAGNOSIS — R300 Dysuria: Secondary | ICD-10-CM | POA: Diagnosis not present

## 2018-05-02 DIAGNOSIS — R0602 Shortness of breath: Secondary | ICD-10-CM | POA: Diagnosis not present

## 2018-05-26 DIAGNOSIS — S6982XA Other specified injuries of left wrist, hand and finger(s), initial encounter: Secondary | ICD-10-CM | POA: Diagnosis not present

## 2018-05-26 DIAGNOSIS — M79641 Pain in right hand: Secondary | ICD-10-CM | POA: Diagnosis not present

## 2018-06-02 DIAGNOSIS — G5602 Carpal tunnel syndrome, left upper limb: Secondary | ICD-10-CM | POA: Diagnosis not present

## 2018-06-04 DIAGNOSIS — M25552 Pain in left hip: Secondary | ICD-10-CM | POA: Diagnosis not present

## 2018-06-16 DIAGNOSIS — M79641 Pain in right hand: Secondary | ICD-10-CM | POA: Diagnosis not present

## 2018-06-16 DIAGNOSIS — S63592D Other specified sprain of left wrist, subsequent encounter: Secondary | ICD-10-CM | POA: Diagnosis not present

## 2018-10-13 DIAGNOSIS — M1711 Unilateral primary osteoarthritis, right knee: Secondary | ICD-10-CM | POA: Diagnosis not present

## 2018-12-12 DIAGNOSIS — J342 Deviated nasal septum: Secondary | ICD-10-CM | POA: Diagnosis not present

## 2018-12-12 DIAGNOSIS — M25552 Pain in left hip: Secondary | ICD-10-CM | POA: Diagnosis not present

## 2018-12-13 DIAGNOSIS — Z0001 Encounter for general adult medical examination with abnormal findings: Secondary | ICD-10-CM | POA: Diagnosis not present

## 2018-12-13 DIAGNOSIS — Z0000 Encounter for general adult medical examination without abnormal findings: Secondary | ICD-10-CM | POA: Diagnosis not present

## 2018-12-13 DIAGNOSIS — Z13220 Encounter for screening for lipoid disorders: Secondary | ICD-10-CM | POA: Diagnosis not present

## 2018-12-13 DIAGNOSIS — Z79899 Other long term (current) drug therapy: Secondary | ICD-10-CM | POA: Diagnosis not present

## 2018-12-27 DIAGNOSIS — Z0000 Encounter for general adult medical examination without abnormal findings: Secondary | ICD-10-CM | POA: Diagnosis not present

## 2018-12-27 DIAGNOSIS — M25561 Pain in right knee: Secondary | ICD-10-CM | POA: Diagnosis not present

## 2018-12-27 DIAGNOSIS — Z01818 Encounter for other preprocedural examination: Secondary | ICD-10-CM | POA: Diagnosis not present

## 2018-12-27 DIAGNOSIS — M150 Primary generalized (osteo)arthritis: Secondary | ICD-10-CM | POA: Diagnosis not present

## 2019-01-25 ENCOUNTER — Ambulatory Visit: Payer: Self-pay | Admitting: Orthopedic Surgery

## 2019-02-24 ENCOUNTER — Encounter (HOSPITAL_COMMUNITY): Admission: RE | Payer: Self-pay | Source: Home / Self Care

## 2019-02-24 ENCOUNTER — Inpatient Hospital Stay (HOSPITAL_COMMUNITY): Admission: RE | Admit: 2019-02-24 | Payer: BLUE CROSS/BLUE SHIELD | Source: Home / Self Care | Admitting: Specialist

## 2019-02-24 SURGERY — TOTAL KNEE REVISION
Anesthesia: Spinal | Laterality: Right

## 2019-05-03 DIAGNOSIS — M1711 Unilateral primary osteoarthritis, right knee: Secondary | ICD-10-CM | POA: Diagnosis not present

## 2019-05-09 ENCOUNTER — Other Ambulatory Visit (HOSPITAL_COMMUNITY)
Admission: RE | Admit: 2019-05-09 | Discharge: 2019-05-09 | Disposition: A | Discharge status: Home / Self Care | Payer: BC Managed Care – PPO | Source: Ambulatory Visit | Attending: Specialist | Admitting: Specialist

## 2019-05-09 DIAGNOSIS — Z1159 Encounter for screening for other viral diseases: Secondary | ICD-10-CM | POA: Diagnosis not present

## 2019-05-09 DIAGNOSIS — Z01812 Encounter for preprocedural laboratory examination: Secondary | ICD-10-CM | POA: Insufficient documentation

## 2019-05-09 LAB — SARS CORONAVIRUS 2 (TAT 6-24 HRS): SARS Coronavirus 2: NEGATIVE

## 2019-05-09 NOTE — Patient Instructions (Addendum)
YOU HAD A COVID 19 TEST ON 05-09-2019,  ONCE YOUR COVID TEST IS COMPLETED, PLEASE BEGIN THE QUARANTINE INSTRUCTIONS AS OUTLINED IN YOUR HANDOUT.                Dustin Brooks Dustin Brooks     Your procedure is scheduled on: 05-12-2019   Report [facility] Main  Entrance  Report to SHORT STAY at  5:30 AM      Call this number if you have problems the morning of surgery [phone]     Remember: BRUSH YOUR TEETH MORNING OF SURGERY AND RINSE YOUR MOUTH OUT, NO CHEWING GUM CANDY OR MINTS.   NO SOLID FOOD AFTER MIDNIGHT THE NIGHT PRIOR TO SURGERY.  NOTHING BY MOUTH EXCEPT CLEAR LIQUIDS UNTIL 4:30 AM.  PLEASE FINISH ENSURE DRINK PER SURGEON ORDER 3 HOURS PRIOR TO SCHEDULED SURGERY TIME WHICH NEEDS TO BE COMPLETED AT 4:30 AM.   CLEAR LIQUID DIET   Foods Allowed                                                                     Foods Excluded  Coffee and tea, regular and decaf                             liquids that you cannot  Plain Jell-O in any flavor                                             see through such as: Fruit ices (not with fruit pulp)                                     milk, soups, orange juice  Iced Popsicles                                    All solid food Carbonated beverages, regular and diet                                    Cranberry, grape and apple juices Sports drinks like Gatorade Lightly seasoned clear broth or consume(fat free) Sugar, honey syrup  Sample Menu Breakfast                                Lunch                                     Supper Cranberry juice                    Beef broth                            Chicken broth  Jell-O                                     Grape juice                           Apple juice Coffee or tea                        Jell-O                                      Popsicle                                                Coffee or tea                        Coffee or  tea  _____________________________________________________________________     Take these medicines the morning of surgery with A SIP OF WATER: none                               You may not have any metal on your body including piercings            Do not wear jewelry,  lotions, powders or , deodorant                         Men may shave face and neck.   Do not bring valuables to the hospital. [place].  Contacts, dentures or bridgework may not be worn into surgery.                    Please read over the following fact sheets you were given: _____________________________________________________________________             [facility] - Preparing for Surgery Before surgery, you can play an important role.   Because skin is not sterile, your skin needs to be as free of germs as possible.   You can reduce the number of germs on your skin by washing with CHG (chlorahexidine gluconate) soap before surgery.   CHG is an antiseptic cleaner which kills germs and bonds with the skin to continue killing germs even after washing. Please DO NOT use if you have an allergy to CHG or antibacterial soaps.   If your skin becomes reddened/irritated stop using the CHG and inform your nurse when you arrive at Short Stay. .  You may shave your face/neck. Please follow these instructions carefully:  1.  Shower with CHG Soap the night before surgery and the  morning of Surgery.  2.  If you choose to wash your hair, wash your hair first as usual with your  normal  shampoo.  3.  After you shampoo, rinse your hair and body thoroughly to remove the  shampoo.  4.  Use CHG as you would any other liquid soap.  You can apply chg directly  to the skin and wash                       Gently with a scrungie or clean washcloth.  5.  Apply the CHG Soap to your body ONLY FROM THE NECK DOWN.   Do not use on face/ open                            Wound or open sores. Avoid contact with eyes, ears mouth and genitals (private parts).                       Wash face,  Genitals (private parts) with your normal soap.             6.  Wash thoroughly, paying special attention to the area where your surgery  will be performed.  7.  Thoroughly rinse your body with warm water from the neck down.  8.  DO NOT shower/wash with your normal soap after using and rinsing off  the CHG Soap.                9.  Pat yourself dry with a clean towel.            10.  Wear clean pajamas.            11.  Place clean sheets on your bed the night of your first shower and do not  sleep with pets. Day of Surgery : Do not apply any lotions/deodorants the morning of surgery.  Please wear clean clothes to the hospital/surgery center.  FAILURE TO FOLLOW THESE INSTRUCTIONS MAY RESULT IN THE CANCELLATION OF YOUR SURGERY PATIENT SIGNATURE_________________________________  NURSE SIGNATURE__________________________________  ________________________________________________________________________   Dustin Brooks  An incentive spirometer is a tool that can help keep your lungs clear and active. This tool measures how well you are filling your lungs with each breath. Taking long deep breaths may help reverse or decrease the chance of developing breathing (pulmonary) problems (especially infection) following:  A long period of time when you are unable to move or be active. BEFORE THE PROCEDURE   If the spirometer includes an indicator to show your best effort, your nurse or respiratory therapist will set it to a desired goal.  If possible, sit up straight or lean slightly forward. Try not to slouch.  Hold the incentive spirometer in an upright position. INSTRUCTIONS FOR USE  1. Sit on the edge of your bed if possible, or sit up as far as you can in bed or on a chair. 2. Hold the incentive spirometer in an upright position. 3. Breathe out  normally. 4. Place the mouthpiece in your mouth and seal your lips tightly around it. 5. Breathe in slowly and as deeply as possible, raising the piston or the ball toward the top of the column. 6. Hold your breath for 3-5 seconds or for as long as possible. Allow the piston or ball to fall to the bottom of the column. 7. Remove the mouthpiece from your mouth and breathe out normally. 8. Rest for a few seconds and repeat Steps 1 through 7 at least 10 times every 1-2 hours when you are awake. Take your time and take a few normal breaths between deep breaths. 9. The spirometer may include an indicator to  show your best effort. Use the indicator as a goal to work toward during each repetition. 10. After each set of 10 deep breaths, practice coughing to be sure your lungs are clear. If you have an incision (the cut made at the time of surgery), support your incision when coughing by placing a pillow or rolled up towels firmly against it. Once you are able to get out of bed, walk around indoors and cough well. You may stop using the incentive spirometer when instructed by your caregiver.  RISKS AND COMPLICATIONS  Take your time so you do not get dizzy or light-headed.  If you are in pain, you may need to take or ask for pain medication before doing incentive spirometry. It is harder to take a deep breath if you are having pain. AFTER USE  Rest and breathe slowly and easily.  It can be helpful to keep track of a log of your progress. Your caregiver can provide you with a simple table to help with this. If you are using the spirometer at home, follow these instructions: [place] IF:   You are having difficultly using the spirometer.  You have trouble using the spirometer as often as instructed.  Your pain medication is not giving enough relief while using the spirometer.  You develop fever of 100.5 F (38.1 C) or higher. SEEK IMMEDIATE MEDICAL CARE IF:   You cough up bloody sputum  that had not been present before.  You develop fever of 102 F (38.9 C) or greater.  You develop worsening pain at or near the incision site. MAKE SURE YOU:   Understand these instructions.  Will watch your condition.  Will get help right away if you are not doing well or get worse. Document Released: 03/01/2007 Document Revised: 01/11/2012 Document Reviewed: 05/02/2007 [facility] Patient Information 2014 [place], [place].   ________________________________________________________________________

## 2019-05-10 ENCOUNTER — Other Ambulatory Visit: Payer: Self-pay

## 2019-05-10 ENCOUNTER — Encounter (HOSPITAL_COMMUNITY)
Admission: RE | Admit: 2019-05-10 | Discharge: 2019-05-10 | Disposition: A | Discharge status: Home / Self Care | Payer: BC Managed Care – PPO | Source: Ambulatory Visit | Attending: Specialist | Admitting: Specialist

## 2019-05-10 ENCOUNTER — Encounter (HOSPITAL_COMMUNITY): Payer: Self-pay

## 2019-05-10 DIAGNOSIS — M1711 Unilateral primary osteoarthritis, right knee: Secondary | ICD-10-CM | POA: Diagnosis not present

## 2019-05-10 DIAGNOSIS — M25561 Pain in right knee: Secondary | ICD-10-CM | POA: Diagnosis not present

## 2019-05-10 DIAGNOSIS — Z01812 Encounter for preprocedural laboratory examination: Secondary | ICD-10-CM | POA: Insufficient documentation

## 2019-05-10 HISTORY — DX: Unspecified osteoarthritis, unspecified site: M19.90

## 2019-05-10 LAB — CBC
HCT: 45.2 % (ref 39.0–52.0)
Hemoglobin: 14.9 g/dL (ref 13.0–17.0)
MCH: 31.3 pg (ref 26.0–34.0)
MCHC: 33 g/dL (ref 30.0–36.0)
MCV: 95 fL (ref 80.0–100.0)
Platelets: 239 10*3/uL (ref 150–400)
RBC: 4.76 MIL/uL (ref 4.22–5.81)
RDW: 12 % (ref 11.5–15.5)
WBC: 7.7 10*3/uL (ref 4.0–10.5)
nRBC: 0 % (ref 0.0–0.2)

## 2019-05-10 LAB — SURGICAL PCR SCREEN
MRSA, PCR: NEGATIVE
Staphylococcus aureus: NEGATIVE

## 2019-05-11 ENCOUNTER — Encounter (HOSPITAL_COMMUNITY): Payer: Self-pay | Admitting: Anesthesiology

## 2019-05-11 MED ORDER — BUPIVACAINE LIPOSOME 1.3 % IJ SUSP
20.0000 mL | Freq: Once | INTRAMUSCULAR | Status: DC
  Filled 2019-05-11: qty 20

## 2019-05-11 NOTE — Anesthesia Preprocedure Evaluation (Addendum)
Anesthesia Evaluation  Patient identified by MRN, date of birth, ID band Patient awake    Reviewed: Allergy & Precautions, NPO status , Patient's Chart, lab work & pertinent test results  Airway Mallampati: I       Dental no notable dental hx. (+) Teeth Intact   Pulmonary neg pulmonary ROS, Current Smoker,    Pulmonary exam normal breath sounds clear to auscultation       Cardiovascular Normal cardiovascular exam Rhythm:Regular Rate:Normal     Neuro/Psych negative neurological ROS  negative psych ROS   GI/Hepatic negative GI ROS, Neg liver ROS,   Endo/Other  negative endocrine ROS  Renal/GU negative Renal ROS  negative genitourinary   Musculoskeletal  (+) Arthritis , Osteoarthritis,    Abdominal Normal abdominal exam  (+)   Peds  Hematology negative hematology ROS (+)   Anesthesia Other Findings   Reproductive/Obstetrics                            Anesthesia Physical Anesthesia Plan  ASA: II  Anesthesia Plan: General   Post-op Pain Management:  Regional for Post-op pain   Induction: Intravenous  PONV Risk Score and Plan: Ondansetron, Dexamethasone and Midazolam  Airway Management Planned: LMA  Additional Equipment:   Intra-op Plan:   Post-operative Plan: Extubation in OR  Informed Consent: I have reviewed the patients History and Physical, chart, labs and discussed the procedure including the risks, benefits and alternatives for the proposed anesthesia with the patient or authorized representative who has indicated his/her understanding and acceptance.     Dental advisory given  Plan Discussed with: CRNA  Anesthesia Plan Comments:        Anesthesia Quick Evaluation

## 2019-05-12 ENCOUNTER — Inpatient Hospital Stay (HOSPITAL_COMMUNITY): Payer: BC Managed Care – PPO | Admitting: Anesthesiology

## 2019-05-12 ENCOUNTER — Inpatient Hospital Stay (HOSPITAL_COMMUNITY)
Admission: RE | Admit: 2019-05-12 | Discharge: 2019-05-13 | DRG: 470 | Disposition: A | Discharge status: Home / Self Care | Payer: BC Managed Care – PPO | Attending: Orthopedic Surgery | Admitting: Orthopedic Surgery

## 2019-05-12 ENCOUNTER — Inpatient Hospital Stay (HOSPITAL_COMMUNITY): Payer: BC Managed Care – PPO

## 2019-05-12 ENCOUNTER — Encounter (HOSPITAL_COMMUNITY): Payer: Self-pay | Admitting: Emergency Medicine

## 2019-05-12 ENCOUNTER — Encounter (HOSPITAL_COMMUNITY)
Admission: RE | Disposition: A | Discharge status: Home / Self Care | Payer: Self-pay | Source: Home / Self Care | Attending: Orthopedic Surgery

## 2019-05-12 ENCOUNTER — Inpatient Hospital Stay (HOSPITAL_COMMUNITY): Payer: BC Managed Care – PPO | Admitting: Physician Assistant

## 2019-05-12 ENCOUNTER — Other Ambulatory Visit: Payer: Self-pay

## 2019-05-12 DIAGNOSIS — M12561 Traumatic arthropathy, right knee: Secondary | ICD-10-CM | POA: Diagnosis not present

## 2019-05-12 DIAGNOSIS — M1711 Unilateral primary osteoarthritis, right knee: Secondary | ICD-10-CM | POA: Diagnosis present

## 2019-05-12 DIAGNOSIS — Z471 Aftercare following joint replacement surgery: Secondary | ICD-10-CM | POA: Diagnosis not present

## 2019-05-12 DIAGNOSIS — F17210 Nicotine dependence, cigarettes, uncomplicated: Secondary | ICD-10-CM | POA: Diagnosis present

## 2019-05-12 DIAGNOSIS — G8918 Other acute postprocedural pain: Secondary | ICD-10-CM | POA: Diagnosis not present

## 2019-05-12 DIAGNOSIS — Z96651 Presence of right artificial knee joint: Secondary | ICD-10-CM | POA: Diagnosis not present

## 2019-05-12 HISTORY — PX: KNEE ARTHROPLASTY: SHX992

## 2019-05-12 SURGERY — ARTHROPLASTY, KNEE, TOTAL, USING IMAGELESS COMPUTER-ASSISTED NAVIGATION
Anesthesia: General | Site: Knee | Laterality: Right

## 2019-05-12 MED ORDER — HYDROMORPHONE HCL 1 MG/ML IJ SOLN
0.2500 mg | INTRAMUSCULAR | Status: DC | PRN
  Administered 2019-05-12 (×4): 0.5 mg via INTRAVENOUS

## 2019-05-12 MED ORDER — HYDROCODONE-ACETAMINOPHEN 7.5-325 MG PO TABS
1.0000 | ORAL_TABLET | ORAL | Status: DC | PRN
  Administered 2019-05-12 – 2019-05-13 (×3): 2 via ORAL
  Filled 2019-05-12 (×4): qty 2

## 2019-05-12 MED ORDER — TRANEXAMIC ACID-NACL 1000-0.7 MG/100ML-% IV SOLN
1000.0000 mg | INTRAVENOUS | Status: AC
  Administered 2019-05-12: 1000 mg via INTRAVENOUS

## 2019-05-12 MED ORDER — ISOPROPYL ALCOHOL 70 % SOLN
Status: DC | PRN
  Administered 2019-05-12: 70 via TOPICAL

## 2019-05-12 MED ORDER — MEPERIDINE HCL 50 MG/ML IJ SOLN: 6.2500 mg | INTRAMUSCULAR | Status: DC | PRN

## 2019-05-12 MED ORDER — SUFENTANIL CITRATE 50 MCG/ML IV SOLN
INTRAVENOUS | Status: AC
  Filled 2019-05-12: qty 1

## 2019-05-12 MED ORDER — SODIUM CHLORIDE (PF) 0.9 % IJ SOLN
INTRAMUSCULAR | Status: AC
  Filled 2019-05-12: qty 10

## 2019-05-12 MED ORDER — CEFAZOLIN SODIUM-DEXTROSE 2-4 GM/100ML-% IV SOLN
2.0000 g | Freq: Four times a day (QID) | INTRAVENOUS | Status: AC
  Administered 2019-05-12 (×2): 2 g via INTRAVENOUS
  Filled 2019-05-12 (×2): qty 100

## 2019-05-12 MED ORDER — FENTANYL CITRATE (PF) 100 MCG/2ML IJ SOLN
50.0000 ug | INTRAMUSCULAR | Status: DC
  Filled 2019-05-12: qty 2

## 2019-05-12 MED ORDER — SUCCINYLCHOLINE CHLORIDE 20 MG/ML IJ SOLN
INTRAMUSCULAR | Status: DC | PRN
  Administered 2019-05-12: 100 mg via INTRAVENOUS

## 2019-05-12 MED ORDER — METOCLOPRAMIDE HCL 5 MG PO TABS: 5.0000 mg | ORAL_TABLET | Freq: Three times a day (TID) | ORAL | Status: DC | PRN

## 2019-05-12 MED ORDER — ROCURONIUM BROMIDE 100 MG/10ML IV SOLN
INTRAVENOUS | Status: DC | PRN
  Administered 2019-05-12: 50 mg via INTRAVENOUS
  Administered 2019-05-12: 20 mg via INTRAVENOUS

## 2019-05-12 MED ORDER — METHOCARBAMOL 500 MG IVPB - SIMPLE MED
500.0000 mg | Freq: Four times a day (QID) | INTRAVENOUS | Status: DC | PRN
  Administered 2019-05-12: 500 mg via INTRAVENOUS
  Filled 2019-05-12: qty 50

## 2019-05-12 MED ORDER — ROCURONIUM BROMIDE 10 MG/ML (PF) SYRINGE
PREFILLED_SYRINGE | INTRAVENOUS | Status: AC
  Filled 2019-05-12: qty 10

## 2019-05-12 MED ORDER — SODIUM CHLORIDE 0.9 % IR SOLN
Status: DC | PRN
  Administered 2019-05-12: 3000 mL

## 2019-05-12 MED ORDER — ONDANSETRON HCL 4 MG PO TABS: 4.0000 mg | ORAL_TABLET | Freq: Three times a day (TID) | ORAL | 0 refills | Status: DC | PRN

## 2019-05-12 MED ORDER — STERILE WATER FOR IRRIGATION IR SOLN
Status: DC | PRN
  Administered 2019-05-12: 1000 mL

## 2019-05-12 MED ORDER — SENNA 8.6 MG PO TABS
1.0000 | ORAL_TABLET | Freq: Two times a day (BID) | ORAL | Status: DC
  Administered 2019-05-12 – 2019-05-13 (×2): 8.6 mg via ORAL
  Filled 2019-05-12 (×2): qty 1

## 2019-05-12 MED ORDER — DEXAMETHASONE SODIUM PHOSPHATE 10 MG/ML IJ SOLN
INTRAMUSCULAR | Status: AC
  Filled 2019-05-12: qty 1

## 2019-05-12 MED ORDER — HYDROCODONE-ACETAMINOPHEN 5-325 MG PO TABS
1.0000 | ORAL_TABLET | ORAL | Status: DC | PRN
  Administered 2019-05-12: 2 via ORAL
  Filled 2019-05-12: qty 2

## 2019-05-12 MED ORDER — KETOROLAC TROMETHAMINE 30 MG/ML IJ SOLN
INTRAMUSCULAR | Status: AC
  Filled 2019-05-12: qty 1

## 2019-05-12 MED ORDER — SODIUM CHLORIDE 0.9% FLUSH
INTRAVENOUS | Status: DC | PRN
  Administered 2019-05-12: 30 mL

## 2019-05-12 MED ORDER — POLYETHYLENE GLYCOL 3350 17 G PO PACK: 17.0000 g | PACK | Freq: Every day | ORAL | Status: DC | PRN

## 2019-05-12 MED ORDER — POVIDONE-IODINE 10 % EX SWAB
2.0000 "application " | Freq: Once | CUTANEOUS | Status: AC
  Administered 2019-05-12: 2 via TOPICAL

## 2019-05-12 MED ORDER — PROMETHAZINE HCL 25 MG/ML IJ SOLN: 6.2500 mg | INTRAMUSCULAR | Status: DC | PRN

## 2019-05-12 MED ORDER — DOCUSATE SODIUM 100 MG PO CAPS: 100.0000 mg | ORAL_CAPSULE | Freq: Two times a day (BID) | ORAL | 3 refills | Status: DC

## 2019-05-12 MED ORDER — ACETAMINOPHEN 10 MG/ML IV SOLN
INTRAVENOUS | Status: DC | PRN
  Administered 2019-05-12: 1000 mg via INTRAVENOUS

## 2019-05-12 MED ORDER — MIDAZOLAM HCL 2 MG/2ML IJ SOLN
1.0000 mg | INTRAMUSCULAR | Status: DC
  Filled 2019-05-12: qty 2

## 2019-05-12 MED ORDER — DEXAMETHASONE SODIUM PHOSPHATE 10 MG/ML IJ SOLN
INTRAMUSCULAR | Status: DC | PRN
  Administered 2019-05-12: 10 mg via INTRAVENOUS

## 2019-05-12 MED ORDER — KETAMINE HCL 10 MG/ML IJ SOLN
INTRAMUSCULAR | Status: AC
  Filled 2019-05-12: qty 1

## 2019-05-12 MED ORDER — TRANEXAMIC ACID-NACL 1000-0.7 MG/100ML-% IV SOLN
1000.0000 mg | INTRAVENOUS | Status: DC
  Filled 2019-05-12: qty 100

## 2019-05-12 MED ORDER — ONDANSETRON HCL 4 MG/2ML IJ SOLN
INTRAMUSCULAR | Status: DC | PRN
  Administered 2019-05-12: 4 mg via INTRAVENOUS

## 2019-05-12 MED ORDER — CELECOXIB 200 MG PO CAPS
200.0000 mg | ORAL_CAPSULE | Freq: Two times a day (BID) | ORAL | Status: DC
  Administered 2019-05-12 – 2019-05-13 (×2): 200 mg via ORAL
  Filled 2019-05-12 (×2): qty 1

## 2019-05-12 MED ORDER — ACETAMINOPHEN 10 MG/ML IV SOLN
INTRAVENOUS | Status: AC
  Filled 2019-05-12: qty 100

## 2019-05-12 MED ORDER — SUFENTANIL CITRATE 50 MCG/ML IV SOLN
INTRAVENOUS | Status: DC | PRN
  Administered 2019-05-12: 10 ug via INTRAVENOUS
  Administered 2019-05-12: 20 ug via INTRAVENOUS
  Administered 2019-05-12 (×5): 10 ug via INTRAVENOUS

## 2019-05-12 MED ORDER — LACTATED RINGERS IV SOLN
INTRAVENOUS | Status: DC
  Administered 2019-05-12 (×2): via INTRAVENOUS

## 2019-05-12 MED ORDER — CLONIDINE HCL (ANALGESIA) 100 MCG/ML EP SOLN
EPIDURAL | Status: DC | PRN
  Administered 2019-05-12: 100 ug

## 2019-05-12 MED ORDER — KETOROLAC TROMETHAMINE 30 MG/ML IJ SOLN: 30.0000 mg | Freq: Once | INTRAMUSCULAR | Status: DC | PRN

## 2019-05-12 MED ORDER — HYDROMORPHONE HCL 1 MG/ML IJ SOLN
INTRAMUSCULAR | Status: AC
  Filled 2019-05-12: qty 2

## 2019-05-12 MED ORDER — EPHEDRINE 5 MG/ML INJ
INTRAVENOUS | Status: AC
  Filled 2019-05-12: qty 10

## 2019-05-12 MED ORDER — ONDANSETRON HCL 4 MG/2ML IJ SOLN
4.0000 mg | Freq: Four times a day (QID) | INTRAMUSCULAR | Status: DC | PRN
  Administered 2019-05-12 – 2019-05-13 (×3): 4 mg via INTRAVENOUS
  Filled 2019-05-12 (×3): qty 2

## 2019-05-12 MED ORDER — SODIUM CHLORIDE 0.9 % IV SOLN
INTRAVENOUS | Status: DC
  Administered 2019-05-12: 150 mL/h via INTRAVENOUS
  Administered 2019-05-12: 21:00:00 via INTRAVENOUS

## 2019-05-12 MED ORDER — METHOCARBAMOL 500 MG IVPB - SIMPLE MED
INTRAVENOUS | Status: AC
  Filled 2019-05-12: qty 50

## 2019-05-12 MED ORDER — MENTHOL 3 MG MT LOZG: 1.0000 | LOZENGE | OROMUCOSAL | Status: DC | PRN

## 2019-05-12 MED ORDER — PROPOFOL 500 MG/50ML IV EMUL
INTRAVENOUS | Status: DC | PRN
  Administered 2019-05-12: 20 mg via INTRAVENOUS
  Administered 2019-05-12: 10 mg via INTRAVENOUS
  Administered 2019-05-12: 160 mg via INTRAVENOUS
  Administered 2019-05-12: 10 mg via INTRAVENOUS

## 2019-05-12 MED ORDER — LIDOCAINE 2% (20 MG/ML) 5 ML SYRINGE
INTRAMUSCULAR | Status: AC
  Filled 2019-05-12: qty 5

## 2019-05-12 MED ORDER — SODIUM CHLORIDE (PF) 0.9 % IJ SOLN
INTRAMUSCULAR | Status: AC
  Filled 2019-05-12: qty 50

## 2019-05-12 MED ORDER — ONDANSETRON HCL 4 MG PO TABS: 4.0000 mg | ORAL_TABLET | Freq: Four times a day (QID) | ORAL | Status: DC | PRN

## 2019-05-12 MED ORDER — HYDROMORPHONE HCL 2 MG/ML IJ SOLN
INTRAMUSCULAR | Status: AC
  Filled 2019-05-12: qty 1

## 2019-05-12 MED ORDER — FENTANYL CITRATE (PF) 100 MCG/2ML IJ SOLN
INTRAMUSCULAR | Status: AC
  Filled 2019-05-12: qty 2

## 2019-05-12 MED ORDER — METOCLOPRAMIDE HCL 5 MG/ML IJ SOLN
5.0000 mg | Freq: Three times a day (TID) | INTRAMUSCULAR | Status: DC | PRN
  Administered 2019-05-12: 14:00:00 10 mg via INTRAVENOUS
  Filled 2019-05-12: qty 2

## 2019-05-12 MED ORDER — HYDROMORPHONE HCL 1 MG/ML IJ SOLN
INTRAMUSCULAR | Status: DC | PRN
  Administered 2019-05-12 (×2): 0.5 mg via INTRAVENOUS
  Administered 2019-05-12: 1 mg via INTRAVENOUS

## 2019-05-12 MED ORDER — SODIUM CHLORIDE (PF) 0.9 % IJ SOLN
INTRAMUSCULAR | Status: DC | PRN
  Administered 2019-05-12: 30 mL

## 2019-05-12 MED ORDER — DEXAMETHASONE SODIUM PHOSPHATE 10 MG/ML IJ SOLN: 8.0000 mg | Freq: Once | INTRAMUSCULAR | Status: DC

## 2019-05-12 MED ORDER — CEFAZOLIN SODIUM-DEXTROSE 2-4 GM/100ML-% IV SOLN
2.0000 g | INTRAVENOUS | Status: AC
  Administered 2019-05-12: 2 g via INTRAVENOUS
  Filled 2019-05-12: qty 100

## 2019-05-12 MED ORDER — PROPOFOL 10 MG/ML IV BOLUS
INTRAVENOUS | Status: AC
  Filled 2019-05-12: qty 60

## 2019-05-12 MED ORDER — METHOCARBAMOL 500 MG PO TABS
500.0000 mg | ORAL_TABLET | Freq: Four times a day (QID) | ORAL | Status: DC | PRN
  Administered 2019-05-13: 500 mg via ORAL
  Filled 2019-05-12: qty 1

## 2019-05-12 MED ORDER — FENTANYL CITRATE (PF) 100 MCG/2ML IJ SOLN
INTRAMUSCULAR | Status: DC | PRN
  Administered 2019-05-12: 100 ug via INTRAVENOUS

## 2019-05-12 MED ORDER — MIDAZOLAM HCL 2 MG/2ML IJ SOLN
INTRAMUSCULAR | Status: AC
  Filled 2019-05-12: qty 2

## 2019-05-12 MED ORDER — HYDROCODONE-ACETAMINOPHEN 5-325 MG PO TABS: 1.0000 | ORAL_TABLET | ORAL | 0 refills | Status: DC | PRN

## 2019-05-12 MED ORDER — ACETAMINOPHEN 325 MG PO TABS: 325.0000 mg | ORAL_TABLET | Freq: Four times a day (QID) | ORAL | Status: DC | PRN

## 2019-05-12 MED ORDER — PHENOL 1.4 % MT LIQD
1.0000 | OROMUCOSAL | Status: DC | PRN
  Filled 2019-05-12: qty 177

## 2019-05-12 MED ORDER — MORPHINE SULFATE (PF) 4 MG/ML IV SOLN
0.5000 mg | INTRAVENOUS | Status: DC | PRN
  Administered 2019-05-12 – 2019-05-13 (×2): 1 mg via INTRAVENOUS
  Filled 2019-05-12 (×2): qty 1

## 2019-05-12 MED ORDER — MIDAZOLAM HCL 2 MG/2ML IJ SOLN
INTRAMUSCULAR | Status: DC | PRN
  Administered 2019-05-12: 2 mg via INTRAVENOUS

## 2019-05-12 MED ORDER — ALUM & MAG HYDROXIDE-SIMETH 200-200-20 MG/5ML PO SUSP: 30.0000 mL | ORAL | Status: DC | PRN

## 2019-05-12 MED ORDER — SUGAMMADEX SODIUM 200 MG/2ML IV SOLN
INTRAVENOUS | Status: DC | PRN
  Administered 2019-05-12: 200 mg via INTRAVENOUS

## 2019-05-12 MED ORDER — DEXAMETHASONE SODIUM PHOSPHATE 10 MG/ML IJ SOLN
10.0000 mg | Freq: Once | INTRAMUSCULAR | Status: AC
  Administered 2019-05-13: 08:00:00 10 mg via INTRAVENOUS
  Filled 2019-05-12: qty 1

## 2019-05-12 MED ORDER — DIPHENHYDRAMINE HCL 12.5 MG/5ML PO ELIX: 12.5000 mg | ORAL_SOLUTION | ORAL | Status: DC | PRN

## 2019-05-12 MED ORDER — BUPIVACAINE-EPINEPHRINE (PF) 0.25% -1:200000 IJ SOLN
INTRAMUSCULAR | Status: AC
  Filled 2019-05-12: qty 30

## 2019-05-12 MED ORDER — SODIUM CHLORIDE 0.9% IV SOLUTION
INTRAVENOUS | Status: AC | PRN
  Administered 2019-05-12: 1000 mL

## 2019-05-12 MED ORDER — ONDANSETRON HCL 4 MG/2ML IJ SOLN
INTRAMUSCULAR | Status: AC
  Filled 2019-05-12: qty 2

## 2019-05-12 MED ORDER — EPHEDRINE SULFATE-NACL 50-0.9 MG/10ML-% IV SOSY
PREFILLED_SYRINGE | INTRAVENOUS | Status: DC | PRN
  Administered 2019-05-12 (×3): 10 mg via INTRAVENOUS

## 2019-05-12 MED ORDER — ASPIRIN 81 MG PO CHEW: 81.0000 mg | CHEWABLE_TABLET | Freq: Two times a day (BID) | ORAL | 1 refills | Status: AC

## 2019-05-12 MED ORDER — ROPIVACAINE HCL 7.5 MG/ML IJ SOLN
INTRAMUSCULAR | Status: DC | PRN
  Administered 2019-05-12 (×6): 5 mL via PERINEURAL

## 2019-05-12 MED ORDER — KETOROLAC TROMETHAMINE 30 MG/ML IJ SOLN
INTRAMUSCULAR | Status: DC | PRN
  Administered 2019-05-12: 30 mg

## 2019-05-12 MED ORDER — KETAMINE HCL 10 MG/ML IJ SOLN
INTRAMUSCULAR | Status: DC | PRN
  Administered 2019-05-12: 50 mg via INTRAVENOUS
  Administered 2019-05-12 (×3): 10 mg via INTRAVENOUS

## 2019-05-12 MED ORDER — BUPIVACAINE LIPOSOME 1.3 % IJ SUSP
20.0000 mL | Freq: Once | INTRAMUSCULAR | Status: DC
  Filled 2019-05-12: qty 20

## 2019-05-12 MED ORDER — SENNA 8.6 MG PO TABS: 2.0000 | ORAL_TABLET | Freq: Every day | ORAL | Status: DC

## 2019-05-12 MED ORDER — DOCUSATE SODIUM 100 MG PO CAPS
100.0000 mg | ORAL_CAPSULE | Freq: Two times a day (BID) | ORAL | Status: DC
  Administered 2019-05-12 – 2019-05-13 (×2): 100 mg via ORAL
  Filled 2019-05-12 (×2): qty 1

## 2019-05-12 MED ORDER — ASPIRIN 81 MG PO CHEW
81.0000 mg | CHEWABLE_TABLET | Freq: Two times a day (BID) | ORAL | Status: DC
  Administered 2019-05-12 – 2019-05-13 (×2): 81 mg via ORAL
  Filled 2019-05-12 (×2): qty 1

## 2019-05-12 SURGICAL SUPPLY — 69 items
BAG ZIPLOCK 12X15 (MISCELLANEOUS) IMPLANT
BANDAGE ACE 4X5 VEL STRL LF (GAUZE/BANDAGES/DRESSINGS) ×2 IMPLANT
BANDAGE ACE 6X5 VEL STRL LF (GAUZE/BANDAGES/DRESSINGS) ×2 IMPLANT
BATTERY INSTRU NAVIGATION (MISCELLANEOUS) ×6 IMPLANT
BLADE SAW RECIPROCATING 77.5 (BLADE) ×2 IMPLANT
BNDG ELASTIC 6X15 VLCR STRL LF (GAUZE/BANDAGES/DRESSINGS) ×2 IMPLANT
CHLORAPREP W/TINT 26 (MISCELLANEOUS) ×4 IMPLANT
COMPONENT TRI CR FEM SZ5 KNEE (Orthopedic Implant) ×1 IMPLANT
COVER SURGICAL LIGHT HANDLE (MISCELLANEOUS) ×2 IMPLANT
COVER WAND RF STERILE (DRAPES) IMPLANT
CUFF TOURN SGL QUICK 34 (TOURNIQUET CUFF) ×1
CUFF TRNQT CYL 34X4.125X (TOURNIQUET CUFF) ×1 IMPLANT
DECANTER SPIKE VIAL GLASS SM (MISCELLANEOUS) ×4 IMPLANT
DERMABOND ADVANCED (GAUZE/BANDAGES/DRESSINGS) ×2
DERMABOND ADVANCED .7 DNX12 (GAUZE/BANDAGES/DRESSINGS) ×2 IMPLANT
DRAPE SHEET LG 3/4 BI-LAMINATE (DRAPES) ×6 IMPLANT
DRAPE U-SHAPE 47X51 STRL (DRAPES) ×2 IMPLANT
DRSG AQUACEL AG ADV 3.5X10 (GAUZE/BANDAGES/DRESSINGS) ×2 IMPLANT
DRSG TEGADERM 4X4.75 (GAUZE/BANDAGES/DRESSINGS) IMPLANT
ELECT BLADE TIP CTD 4 INCH (ELECTRODE) ×2 IMPLANT
ELECT REM PT RETURN 15FT ADLT (MISCELLANEOUS) ×2 IMPLANT
EVACUATOR 1/8 PVC DRAIN (DRAIN) IMPLANT
GAUZE SPONGE 4X4 12PLY STRL (GAUZE/BANDAGES/DRESSINGS) ×2 IMPLANT
GLOVE BIO SURGEON STRL SZ8.5 (GLOVE) ×4 IMPLANT
GLOVE BIOGEL PI IND STRL 8.5 (GLOVE) ×1 IMPLANT
GLOVE BIOGEL PI INDICATOR 8.5 (GLOVE) ×1
GOWN SPEC L3 XXLG W/TWL (GOWN DISPOSABLE) ×2 IMPLANT
HANDPIECE INTERPULSE COAX TIP (DISPOSABLE) ×1
HOLDER FOLEY CATH W/STRAP (MISCELLANEOUS) ×2 IMPLANT
HOOD PEEL AWAY FLYTE STAYCOOL (MISCELLANEOUS) ×6 IMPLANT
INSERT TRIATH CS X3 11 (Insert) ×2 IMPLANT
JET LAVAGE IRRISEPT WOUND (IRRIGATION / IRRIGATOR) ×2
KIT TURNOVER KIT A (KITS) IMPLANT
KNEE PATELLA ASYMMETRIC 10X32 (Knees) ×2 IMPLANT
KNEE TIBIAL COMPONENT SZ5 (Knees) ×2 IMPLANT
LAVAGE JET IRRISEPT WOUND (IRRIGATION / IRRIGATOR) ×1 IMPLANT
MARKER SKIN DUAL TIP RULER LAB (MISCELLANEOUS) ×2 IMPLANT
NDL SAFETY ECLIPSE 18X1.5 (NEEDLE) ×1 IMPLANT
NEEDLE HYPO 18GX1.5 SHARP (NEEDLE) ×1
NEEDLE SPNL 18GX3.5 QUINCKE PK (NEEDLE) ×2 IMPLANT
NS IRRIG 1000ML POUR BTL (IV SOLUTION) ×2 IMPLANT
PACK TOTAL KNEE CUSTOM (KITS) ×2 IMPLANT
PADDING CAST COTTON 6X4 STRL (CAST SUPPLIES) ×2 IMPLANT
PIN FLUTED HEDLESS FIX 3.5X1/8 (PIN) ×2 IMPLANT
PROTECTOR NERVE ULNAR (MISCELLANEOUS) ×2 IMPLANT
SAW OSC TIP CART 19.5X105X1.3 (SAW) ×2 IMPLANT
SEALER BIPOLAR AQUA 6.0 (INSTRUMENTS) ×2 IMPLANT
SET HNDPC FAN SPRY TIP SCT (DISPOSABLE) ×1 IMPLANT
SET PAD KNEE POSITIONER (MISCELLANEOUS) ×2 IMPLANT
SPONGE DRAIN TRACH 4X4 STRL 2S (GAUZE/BANDAGES/DRESSINGS) IMPLANT
SPONGE LAP 18X18 RF (DISPOSABLE) IMPLANT
SUT MNCRL AB 3-0 PS2 18 (SUTURE) ×2 IMPLANT
SUT MNCRL AB 4-0 PS2 18 (SUTURE) ×2 IMPLANT
SUT MON AB 2-0 CT1 36 (SUTURE) ×4 IMPLANT
SUT STRATAFIX PDO 1 14 VIOLET (SUTURE) ×1
SUT STRATFX PDO 1 14 VIOLET (SUTURE) ×1
SUT VIC AB 1 CTX 36 (SUTURE) ×2
SUT VIC AB 1 CTX36XBRD ANBCTR (SUTURE) ×2 IMPLANT
SUT VIC AB 2-0 CT1 27 (SUTURE) ×1
SUT VIC AB 2-0 CT1 TAPERPNT 27 (SUTURE) ×1 IMPLANT
SUTURE STRATFX PDO 1 14 VIOLET (SUTURE) ×1 IMPLANT
SYR 3ML LL SCALE MARK (SYRINGE) ×2 IMPLANT
SYR 50ML LL SCALE MARK (SYRINGE) ×2 IMPLANT
TOWER CARTRIDGE SMART MIX (DISPOSABLE) IMPLANT
TRAY FOLEY MTR SLVR 16FR STAT (SET/KITS/TRAYS/PACK) ×2 IMPLANT
TRI CRUC RET FEM SZ5 KNEE (Orthopedic Implant) ×2 IMPLANT
WATER STERILE IRR 1000ML POUR (IV SOLUTION) ×4 IMPLANT
WRAP KNEE MAXI GEL POST OP (GAUZE/BANDAGES/DRESSINGS) ×2 IMPLANT
YANKAUER SUCT BULB TIP 10FT TU (MISCELLANEOUS) ×2 IMPLANT
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
## 2019-05-12 NOTE — Progress Notes (Signed)
Postop pulse check in PACU  Sleeping comfortably RLE: pulses 2+ DP and equivalent to preop

## 2019-05-12 NOTE — Anesthesia Postprocedure Evaluation (Signed)
Anesthesia Post Note  Patient: Dustin Brooks  Procedure(s) Performed: COMPUTER ASSISTED TOTAL KNEE ARTHROPLASTY (Right Knee)     Patient location during evaluation: PACU Anesthesia Type: General Level of consciousness: sedated Pain management: pain level controlled Vital Signs Assessment: post-procedure vital signs reviewed and stable Respiratory status: spontaneous breathing Cardiovascular status: stable Postop Assessment: no apparent nausea or vomiting Anesthetic complications: no    Last Vitals:  Vitals:   05/12/19 1215 05/12/19 1230  BP: 136/88 119/68  Pulse: 72 68  Resp: 12 10  Temp:  36.6 C  SpO2: 98% 98%    Last Pain:  Vitals:   05/12/19 1230  TempSrc:   PainSc: Asleep   Pain Goal: Patients Stated Pain Goal: 4 (05/12/19 0618)                 Huston Foley

## 2019-05-12 NOTE — Op Note (Addendum)
OPERATIVE REPORT  SURGEON: Samson FredericBrian Courtni Balash, MD   ASSISTANT: Benny Lennert. Andrew Collins, MD.  PREOPERATIVE DIAGNOSIS: Right knee arthritis.   POSTOPERATIVE DIAGNOSIS: Right knee arthritis.   PROCEDURE: Right total knee arthroplasty.   IMPLANTS: Stryker Triathlon CR femur, size 5. Stryker Tritanium tibia, size 5. X3 polyethelyene insert, size 11 mm, CS. 3 button asymmetric patella, size 32 mm.  ANESTHESIA:  General and Regional  TOURNIQUET TIME: Not utilized.   ESTIMATED BLOOD LOSS:-250 mL    ANTIBIOTICS: 2 g Ancef.  DRAINS: None.  COMPLICATIONS: None   CONDITION: PACU - hemodynamically stable.   BRIEF CLINICAL NOTE: Dustin Brooks is a 38 y.o. male with a long-standing history of Right knee arthritis secondary to knee dislocation with previous ligamentous reconstruction and arterial bypass. After failing conservative management, the patient was indicated for total knee arthroplasty. The risks, benefits, and alternatives to the procedure were explained, and the patient elected to proceed.  PROCEDURE IN DETAIL: Adductor canal block was obtained in the pre-op holding area. Once inside the operative room, general anesthesia was obtained, and a foley catheter was inserted. The patient was then positioned, a nonsterile tourniquet was placed, and the lower extremity was prepped and draped in the normal sterile surgical fashion.  A time-out was called verifying side and site of surgery. The patient received IV antibiotics within 60 minutes of beginning the procedure. The tourniquet was not utilized.   An anterior approach to the knee was performed utilizing a midvastus arthrotomy. A medial release was performed and the patellar fat pad was excised. Stryker navigation was used to cut the distal femur perpendicular to the mechanical axis. A freehand patellar resection was performed, and the  patella was sized an prepared with 3 lug holes.  Nagivation was used to make a neutral proximal tibia resection, taking 9 mm of bone from the less affected lateral side with 3 degrees of slope. The menisci were excised. A spacer block was placed, and the alignment and balance in extension were confirmed.   The distal femur was sized using the 3-degree external rotation guide referencing the posterior femoral cortex. The appropriate 4-in-1 cutting block was pinned into place. Rotation was checked using Whiteside's line, the epicondylar axis, and then confirmed with a spacer block in flexion. The remaining femoral cuts were performed, taking care to protect the MCL.  The tibia was sized and the trial tray was pinned into place. The remaining trail components were inserted. The knee was stable to varus and valgus stress through a full range of motion. The patella tracked centrally, and the PCL was well balanced. The trial components were removed, and the proximal tibial surface was prepared. Final components were impacted into place. The knee was tested for a final time and found to be well balanced.   The wound was copiously irrigated with Irrisept solution and normal saline using pulse lavage.  Marcaine solution was injected into the periarticular soft tissue.  The wound was closed in layers using #1 Vicryl and Stratafix for the fascia, 2-0 Vicryl for the subcutaneous fat, 2-0 Monocryl for the deep dermal layer, 3-0 running Monocryl subcuticular Stitch, and 4-0 Monocryl stay sutures at both ends of the wound. Dermabond was applied to the skin.  Once the glue was fully dried, an Aquacell Ag and compressive dressing were applied.  Tthe patient was transported to the recovery room in stable condition.  Sponge, needle, and instrument counts were correct at the end of the case x2.  The patient tolerated the  procedure well and there were no known complications.  Please note that a surgical assistant was a medical  necessity for this procedure in order to perform it in a safe and expeditious manner. Surgical assistant was necessary to retract the ligaments and vital neurovascular structures to prevent injury to them and also necessary for proper positioning of the limb to allow for anatomic placement of the prosthesis.

## 2019-05-12 NOTE — Anesthesia Procedure Notes (Signed)
Date/Time: 05/12/2019 8:05 AM Performed by: Sharlette Dense, CRNA Oxygen Delivery Method: Nasal cannula

## 2019-05-12 NOTE — Discharge Instructions (Signed)
° °Dr. Leathie Weich °Total Joint Specialist °[facility] °[address] °[place], [place] [address] °[phone] ° °TOTAL KNEE REPLACEMENT POSTOPERATIVE DIRECTIONS ° ° ° °Knee Rehabilitation, Guidelines Following Surgery  °Results after knee surgery are often greatly improved when you follow the exercise, range of motion and muscle strengthening exercises prescribed by your doctor. Safety measures are also important to protect the knee from further injury. Any time any of these exercises cause you to have increased pain or swelling in your knee joint, decrease the amount until you are comfortable again and slowly increase them. If you have problems or questions, call your caregiver or physical therapist for advice.  ° °WEIGHT BEARING °Weight bearing as tolerated with assist device (walker, cane, etc) as directed, use it as long as suggested by your surgeon or therapist, typically at least 4-6 weeks. ° °HOME CARE INSTRUCTIONS  °Remove items at home which could result in a fall. This includes throw rugs or furniture in walking pathways.  °Continue medications as instructed at time of discharge. °You may have some home medications which will be placed on hold until you complete the course of blood thinner medication.  °You may start showering once you are discharged home but do not submerge the incision under water. Just pat the incision dry and apply a dry gauze dressing on daily. °Walk with walker as instructed.  °You may resume a sexual relationship in one month or when given the OK by your doctor.  °· Use walker as long as suggested by your caregivers. °· Avoid periods of inactivity such as sitting longer than an hour when not asleep. This helps prevent blood clots.  °You may put full weight on your legs and walk as much as is comfortable.  °You may return to work once you are cleared by your doctor.  °Do not drive a car for 6 weeks or until released by you surgeon.  °· Do not drive  while taking narcotics.  °Wear the elastic stockings for three weeks following surgery during the day but you may remove then at night. °Make sure you keep all of your appointments after your operation with all of your doctors and caregivers. You should call the office at the above phone number and make an appointment for approximately two weeks after the date of your surgery. °Do not remove your surgical dressing. The dressing is waterproof; you may take showers in 3 days, but do not take tub baths or submerge the dressing. °Please pick up a stool softener and laxative for home use as long as you are requiring pain medications. °· ICE to the affected knee every three hours for 30 minutes at a time and then as needed for pain and swelling.  Continue to use ice on the knee for pain and swelling from surgery. You may notice swelling that will progress down to the foot and ankle.  This is normal after surgery.  Elevate the leg when you are not up walking on it.   °It is important for you to complete the blood thinner medication as prescribed by your doctor. °· Continue to use the breathing machine which will help keep your temperature down.  It is common for your temperature to cycle up and down following surgery, especially at night when you are not up moving around and exerting yourself.  The breathing machine keeps your lungs expanded and your temperature down. ° °RANGE OF MOTION AND STRENGTHENING EXERCISES  °Rehabilitation of the knee is important following   a knee injury or an operation. After just a few days of immobilization, the muscles of the thigh which control the knee become weakened and shrink (atrophy). Knee exercises are designed to build up the tone and strength of the thigh muscles and to improve knee motion. Often times heat used for twenty to thirty minutes before working out will loosen up your tissues and help with improving the range of motion but do not use heat for the first two weeks following  surgery. These exercises can be done on a training (exercise) mat, on the floor, on a table or on a bed. Use what ever works the best and is most comfortable for you Knee exercises include:  °Leg Lifts - While your knee is still immobilized in a splint or cast, you can do straight leg raises. Lift the leg to 60 degrees, hold for 3 sec, and slowly lower the leg. Repeat 10-20 times 2-3 times daily. Perform this exercise against resistance later as your knee gets better.  °Quad and Hamstring Sets - Tighten up the muscle on the front of the thigh (Quad) and hold for 5-10 sec. Repeat this 10-20 times hourly. Hamstring sets are done by pushing the foot backward against an object and holding for 5-10 sec. Repeat as with quad sets.  °A rehabilitation program following serious knee injuries can speed recovery and prevent re-injury in the future due to weakened muscles. Contact your doctor or a physical therapist for more information on knee rehabilitation.  ° °SKILLED REHAB INSTRUCTIONS: °If the patient is transferred to a skilled rehab facility following release from the hospital, a list of the current medications will be sent to the facility for the patient to continue.  When discharged from the skilled rehab facility, please have the facility set up the patient's Home Health Physical Therapy prior to being released. Also, the skilled facility will be responsible for providing the patient with their medications at time of release from the facility to include their pain medication, the muscle relaxants, and their blood thinner medication. If the patient is still at the rehab facility at time of the two week follow up appointment, the skilled rehab facility will also need to assist the patient in arranging follow up appointment in our office and any transportation needs. ° °MAKE SURE YOU:  °Understand these instructions.  °Will watch your condition.  °Will get help right away if you are not doing well or get worse.   ° ° °Pick up stool softner and laxative for home use following surgery while on pain medications. °Do NOT remove your dressing. You may shower.  °Do not take tub baths or submerge incision under water. °May shower starting three days after surgery. °Please use a clean towel to pat the incision dry following showers. °Continue to use ice for pain and swelling after surgery. °Do not use any lotions or creams on the incision until instructed by your surgeon. ° °

## 2019-05-12 NOTE — Anesthesia Procedure Notes (Signed)
Anesthesia Regional Block: Adductor canal block   Pre-Anesthetic Checklist: ,, timeout performed, Correct Patient, Correct Site, Correct Laterality, Correct Procedure, Correct Position, site marked, Risks and benefits discussed,  Surgical consent,  Pre-op evaluation,  At surgeon's request and post-op pain management  Laterality: Lower and Right  Prep: chloraprep       Needles:  Injection technique: Single-shot  Needle Type: Echogenic Stimulator Needle     Needle Length: 10cm  Needle Gauge: 21   Needle insertion depth: 2 cm   Additional Needles:   Procedures:,,,, ultrasound used (permanent image in chart),,,,  Narrative:  Start time: 05/12/2019 8:11 AM End time: 05/12/2019 8:21 AM Injection made incrementally with aspirations every 5 mL. Anesthesiologist: Lyn Hollingshead, MD

## 2019-05-12 NOTE — H&P (Signed)
TOTAL KNEE ADMISSION H&P  Patient is being admitted for right total knee arthroplasty.  Subjective:  Chief Complaint:right knee pain.  HPI: Dustin Schiano Hulan Saasi Brooks, 38 y.o. male, has a history of pain and functional disability in the right knee due to arthritis and has failed non-surgical conservative treatments for greater than 12 weeks to includeNSAID's and/or analgesics, corticosteriod injections, viscosupplementation injections, flexibility and strengthening excercises, use of assistive devices and activity modification.  Onset of symptoms was gradual, starting >10 years ago with gradually worsening course since that time. The patient noted prior procedures on the knee to include  multilig reconstruction and arterial bypass s/p knee dislocation on the right knee(s).  Patient currently rates pain in the right knee(s) at 10 out of 10 with activity. Patient has night pain, worsening of pain with activity and weight bearing, pain that interferes with activities of daily living, pain with passive range of motion, crepitus and joint swelling.  Patient has evidence of subchondral cysts, subchondral sclerosis, periarticular osteophytes and joint space narrowing by imaging studies. This patient has had previous knee dislocation. There is no active infection.  There are no active problems to display for this patient.  Past Medical History:  Diagnosis Date  . Arthritis    neck back shoulder  . Fibula fracture 04/2016   left  . Pilon fracture of left tibia 04/2016    Past Surgical History:  Procedure Laterality Date  . BLADDER SURGERY     bladder rupture  . EXTERNAL FIXATION REMOVAL Left 05/14/2016   Procedure: REMOVAL OF EXTERNAL FIXATOR;  Surgeon: Toni ArthursJohn Hewitt, MD;  Location: [facility];  Service: Orthopedics;  Laterality: Left;  . EXTERNAL FIXATOR APPLICATION Left 04/30/2016   tib/fib  . KNEE DISLOCATION SURGERY Right   . OPEN REDUCTION INTERNAL FIXATION (ORIF) TIBIA/FIBULA  FRACTURE Left 05/14/2016   Procedure: OPEN REDUCTION INTERNAL FIXATION LEFT FIBULA AND TIBIAL PILON FRACTURE (ORIF);  Surgeon: Toni ArthursJohn Hewitt, MD;  Location: [facility];  Service: Orthopedics;  Laterality: Left;  . ORIF SHOULDER FRACTURE Left     Current Facility-Administered Medications  Medication Dose Route Frequency Provider Last Rate Last Dose  . acetaminophen (OFIRMEV) 10 MG/ML IV           . bupivacaine liposome (EXPAREL) 1.3 % injection 266 mg  20 mL Other Once Eugenia Mcalpineollins, Robert, MD      . bupivacaine liposome (EXPAREL) 1.3 % injection 266 mg  20 mL Other Once Samson FredericSwinteck, Revecca Nachtigal, MD      . ceFAZolin (ANCEF) IVPB 2g/100 mL premix  2 g Intravenous On Call to OR Eugenia Mcalpineollins, Robert, MD      . dexamethasone (DECADRON) injection 8 mg  8 mg Intravenous Once Eugenia Mcalpineollins, Robert, MD      . fentaNYL (SUBLIMAZE) injection 50-100 mcg  50-100 mcg Intravenous Octaviano GlowUD Hatchett, Franklin, MD      . lactated ringers infusion   Intravenous Continuous Leilani AbleHatchett, Franklin, MD 50 mL/hr at 05/12/19 [phone]    . midazolam (VERSED) injection 1-2 mg  1-2 mg Intravenous UD Hatchett, Franklin, MD      . tranexamic acid (CYKLOKAPRON) IVPB 1,000 mg  1,000 mg Intravenous To OR Eugenia Mcalpineollins, Robert, MD      . tranexamic acid (CYKLOKAPRON) IVPB 1,000 mg  1,000 mg Intravenous To OR Fatiha Guzy, Arlys JohnBrian, MD       Facility-Administered Medications Ordered in Other Encounters  Medication Dose Route Frequency Provider Last Rate Last Dose  . fentaNYL (SUBLIMAZE) injection    Anesthesia Intra-op Florene Routeeardon, Diana L,  CRNA   100 mcg at 05/12/19 0810  . midazolam (VERSED) injection    Anesthesia Intra-op Sharlette Dense, CRNA   2 mg at 05/12/19 0810  . propofol (DIPRIVAN) 500 MG/50ML infusion    Anesthesia Intra-op Sharlette Dense, CRNA   10 mg at 05/12/19 3762   No Known Allergies  Social History   Tobacco Use  . Smoking status: Current Every Day Smoker    Packs/day: 0.25    Years: 15.00    Pack years: 3.75    Types: Cigarettes  .  Smokeless tobacco: Never Used  Substance Use Topics  . Alcohol use: Yes    Comment: occasionally    History reviewed. No pertinent family history.   Review of Systems  Musculoskeletal: Positive for joint pain.  All other systems reviewed and are negative.   Objective:  Physical Exam  Vitals reviewed. Constitutional: He is oriented to person, place, and time. He appears well-developed and well-nourished.  HENT:  Head: Normocephalic and atraumatic.  Eyes: Pupils are equal, round, and reactive to light. Conjunctivae and EOM are normal.  Neck: Normal range of motion. Neck supple.  Cardiovascular: Normal rate, regular rhythm and intact distal pulses.  Respiratory: Effort normal. No respiratory distress.  GI: Soft. He exhibits no distension.  Genitourinary:    Genitourinary Comments: deferred   Musculoskeletal:     Right knee: He exhibits decreased range of motion and swelling. Tenderness found. Medial joint line and lateral joint line tenderness noted.     Comments: 2+ DP  Neurological: He is alert and oriented to person, place, and time. He has normal reflexes.  Skin: Skin is warm and dry.  Psychiatric: He has a normal mood and affect. His behavior is normal. Judgment and thought content normal.    Vital signs in last 24 hours: Temp:  [97.5 F (36.4 C)] 97.5 F (36.4 C) (07/10 0607) Pulse Rate:  [65] 65 (07/10 0607) Resp:  [17] 17 (07/10 0607) BP: (110)/(73) 110/73 (07/10 0607) SpO2:  [95 %] 95 % (07/10 0607) Weight:  [77.1 kg] 77.1 kg (07/10 0618)  Labs:   Estimated body mass index is 23.71 kg/m as calculated from the following:   Height as of this encounter: 5\' 11"  (1.803 m).   Weight as of this encounter: 77.1 kg.   Imaging Review Plain radiographs demonstrate severe degenerative joint disease of the right knee(s). The overall alignment isneutral. The bone quality appears to be adequate for age and reported activity level.      Assessment/Plan:  End stage  arthritis, right knee   The patient history, physical examination, clinical judgment of the provider and imaging studies are consistent with end stage degenerative joint disease of the right knee(s) and total knee arthroplasty is deemed medically necessary. The treatment options including medical management, injection therapy arthroscopy and arthroplasty were discussed at length. The risks and benefits of total knee arthroplasty were presented and reviewed. The risks due to aseptic loosening, infection, stiffness, patella tracking problems, thromboembolic complications and other imponderables were discussed. The patient acknowledged the explanation, agreed to proceed with the plan and consent was signed. Patient is being admitted for inpatient treatment for surgery, pain control, PT, OT, prophylactic antibiotics, VTE prophylaxis, progressive ambulation and ADL's and discharge planning. The patient is planning to be discharged home with OPPT    Anticipated LOS equal to or greater than 2 midnights due to - Age 36 and older with one or more of the following:  - Obesity  - Expected  need for hospital services (PT, OT, Nursing) required for safe  discharge  - Anticipated need for postoperative skilled nursing care or inpatient rehab  - Active co-morbidities: previous arterial bypass OR   - Unanticipated findings during/Post Surgery: None  - Patient is a high risk of re-admission due to: None

## 2019-05-12 NOTE — Transfer of Care (Signed)
Immediate Anesthesia Transfer of Care Note  Patient: Dustin Brooks  Procedure(s) Performed: COMPUTER ASSISTED TOTAL KNEE ARTHROPLASTY (Right Knee)  Patient Location: PACU  Anesthesia Type:GA combined with regional for post-op pain  Level of Consciousness: awake, alert  and oriented  Airway & Oxygen Therapy: Patient Spontanous Breathing and Patient connected to face mask oxygen  Post-op Assessment: Report given to RN and Post -op Vital signs reviewed and stable  Post vital signs: Reviewed and stable  Last Vitals:  Vitals Value Taken Time  BP    Temp    Pulse    Resp    SpO2      Last Pain:  Vitals:   05/12/19 0618  TempSrc:   PainSc: 7       Patients Stated Pain Goal: 4 (81/85/63 1497)  Complications: No apparent anesthesia complications

## 2019-05-12 NOTE — Anesthesia Procedure Notes (Signed)
Procedure Name: Intubation Date/Time: 05/12/2019 8:58 AM Performed by: Sharlette Dense, CRNA Patient Re-evaluated:Patient Re-evaluated prior to induction Oxygen Delivery Method: Circle system utilized Preoxygenation: Pre-oxygenation with 100% oxygen Induction Type: IV induction and Rapid sequence Laryngoscope Size: Miller and 3 Grade View: Grade I Tube type: Oral Tube size: 8.0 mm Number of attempts: 1 Airway Equipment and Method: Stylet Placement Confirmation: ETT inserted through vocal cords under direct vision,  positive ETCO2 and breath sounds checked- equal and bilateral Secured at: 22 cm Tube secured with: Tape Dental Injury: Teeth and Oropharynx as per pre-operative assessment

## 2019-05-12 NOTE — Discharge Summary (Signed)
Physician Discharge Summary  Patient ID: Dustin Brooks MRN: [id number] DOB/AGE: 07/06/1981 38 y.o.  Admit date: 05/12/2019 Discharge date: 05/13/2019  Admission Diagnoses:  Traumatic arthropathy of knee, right  Discharge Diagnoses:  Principal Problem:   Traumatic arthropathy of knee, right Active Problems:   Degenerative arthritis of right knee   Past Medical History:  Diagnosis Date  . Arthritis    neck back shoulder  . Fibula fracture 04/2016   left  . Pilon fracture of left tibia 04/2016    Surgeries: Procedure(Brooks): COMPUTER ASSISTED TOTAL KNEE ARTHROPLASTY on 05/12/2019   Consultants (if any):   Discharged Condition: Improved  Hospital Course: Dustin Brooks is an 38 y.o. male who was admitted 05/12/2019 with a diagnosis of Traumatic arthropathy of knee, right and went to the operating room on 05/12/2019 and underwent the above named procedures.    He was given perioperative antibiotics:  Anti-infectives (From admission, onward)   Start     Dose/Rate Route Frequency Ordered Stop   05/12/19 1500  ceFAZolin (ANCEF) IVPB 2g/100 mL premix     2 g 200 mL/hr over 30 Minutes Intravenous Every 6 hours 05/12/19 1348 05/12/19 2216   05/12/19 0615  ceFAZolin (ANCEF) IVPB 2g/100 mL premix     2 g 200 mL/hr over 30 Minutes Intravenous On call to O.R. 05/12/19 7124 05/12/19 0910    .  He was given sequential compression devices, early ambulation, and ASA for DVT prophylaxis.  He benefited maximally from the hospital stay and there were no complications.    Recent vital signs:  Vitals:   05/13/19 0530 05/13/19 0902  BP:  126/82  Pulse: (!) 57 (!) 56  Resp:  16  Temp:  98 F (36.7 C)  SpO2: 99% 100%    Recent laboratory studies:  Lab Results  Component Value Date   HGB 13.0 05/13/2019   HGB 14.9 05/10/2019   Lab Results  Component Value Date   WBC 19.6 (H) 05/13/2019   PLT 233 05/13/2019   No results found for: INR Lab Results  Component  Value Date   NA 139 05/13/2019   K 4.4 05/13/2019   CL 104 05/13/2019   CO2 25 05/13/2019   BUN 15 05/13/2019   CREATININE 0.89 05/13/2019   GLUCOSE 103 (H) 05/13/2019    Discharge Medications:   Allergies as of 05/13/2019   No Known Allergies     Medication List    TAKE these medications   aspirin 81 MG chewable tablet Commonly known as: Aspirin Childrens Chew 1 tablet (81 mg total) by mouth 2 (two) times daily with a meal.   docusate sodium 100 MG capsule Commonly known as: Colace Take 1 capsule (100 mg total) by mouth 2 (two) times daily.   HYDROcodone-acetaminophen 5-325 MG tablet Commonly known as: Norco Take 1 tablet by mouth every 4 (four) hours as needed for moderate pain.   ondansetron 4 MG tablet Commonly known as: Zofran Take 1 tablet (4 mg total) by mouth every 8 (eight) hours as needed for nausea or vomiting.   senna 8.6 MG Tabs tablet Commonly known as: SENOKOT Take 2 tablets (17.2 mg total) by mouth at bedtime.   VITAMIN B-12 PO Take 1 tablet by mouth 3 (three) times a week.       Diagnostic Studies: Dg Knee Right Port  Result Date: 05/12/2019 CLINICAL DATA:  Postoperative right knee replacement. EXAM: PORTABLE RIGHT KNEE - 1-2 VIEW COMPARISON:  None. FINDINGS: Right knee replacement is  identified without malalignment. Postoperative change with joint air and fluid are noted. IMPRESSION: Right knee replacement without malalignment.  Postoperative changes. Electronically Signed   By: Dustin ReinWei-Chen  Brooks M.D.   On: 05/12/2019 12:32    Disposition:   Discharge Instructions    Call MD / Call 911   Complete by: As directed    If you experience chest pain or shortness of breath, CALL 911 and be transported to the hospital emergency room.  If you develope a fever above 101 F, pus (white drainage) or increased drainage or redness at the wound, or calf pain, call your surgeon'Brooks office.   Constipation Prevention   Complete by: As directed    Drink plenty of  fluids.  Prune juice may be helpful.  You may use a stool softener, such as Colace (over the counter) 100 mg twice a day.  Use MiraLax (over the counter) for constipation as needed.   Diet - low sodium heart healthy   Complete by: As directed    Increase activity slowly as tolerated   Complete by: As directed       Follow-up Information    Dustin Brooks, Dustin S, MD. Schedule an appointment as soon as possible for a visit in 2 weeks.   Specialty: Ophthalmology Why: For wound re-check           Signed: Iline OvenBrian J Ajanee Brooks 05/13/2019, 8:14 PM

## 2019-05-13 LAB — BASIC METABOLIC PANEL
Anion gap: 10 (ref 5–15)
BUN: 15 mg/dL (ref 6–20)
CO2: 25 mmol/L (ref 22–32)
Calcium: 9.1 mg/dL (ref 8.9–10.3)
Chloride: 104 mmol/L (ref 98–111)
Creatinine, Ser: 0.89 mg/dL (ref 0.61–1.24)
GFR calc Af Amer: >60 mL/min (ref >60)
GFR calc non Af Amer: >60 mL/min (ref >60)
Glucose, Bld: 103 mg/dL — ABNORMAL HIGH (ref 70–99)
Potassium: 4.4 mmol/L (ref 3.5–5.1)
Sodium: 139 mmol/L (ref 135–145)

## 2019-05-13 LAB — CBC
HCT: 40.1 % (ref 39.0–52.0)
Hemoglobin: 13 g/dL (ref 13.0–17.0)
MCH: 32.2 pg (ref 26.0–34.0)
MCHC: 32.4 g/dL (ref 30.0–36.0)
MCV: 99.3 fL (ref 80.0–100.0)
Platelets: 233 10*3/uL (ref 150–400)
RBC: 4.04 MIL/uL — ABNORMAL LOW (ref 4.22–5.81)
RDW: 12 % (ref 11.5–15.5)
WBC: 19.6 10*3/uL — ABNORMAL HIGH (ref 4.0–10.5)
nRBC: 0 % (ref 0.0–0.2)

## 2019-05-13 NOTE — Progress Notes (Signed)
Subjective: 1 Day Post-Op Procedure(s) (LRB): COMPUTER ASSISTED TOTAL KNEE ARTHROPLASTY (Right) Patient reports pain as 1 on 0-10 scale. He is doing very well. He is ready for Dc  He has an excellent Dorsalis Pedis Pulse . Objective: Vital signs in last 24 hours: Temp:  [97.8 F (36.6 C)-98.4 F (36.9 C)] 98.3 F (36.8 C) (07/11 0520) Pulse Rate:  [53-84] 57 (07/11 0530) Resp:  [10-18] 16 (07/11 0520) BP: (112-141)/(68-92) 123/69 (07/11 0520) SpO2:  [84 %-100 %] 99 % (07/11 0530) Weight:  [77.1 kg] 77.1 kg (07/10 1330)  Intake/Output from previous day: 07/10 0701 - 07/11 0700 In: 4838.3 [P.O.:700; I.V.:3838.3; IV Piggyback:300] Out: 2580 [Urine:2330; Blood:250] Intake/Output this shift: No intake/output data recorded.  Recent Labs    05/10/19 1054 05/13/19 0424  HGB 14.9 13.0   Recent Labs    05/10/19 1054 05/13/19 0424  WBC 7.7 19.6*  RBC 4.76 4.04*  HCT 45.2 40.1  PLT 239 233   Recent Labs    05/13/19 0424  NA 139  K 4.4  CL 104  CO2 25  BUN 15  CREATININE 0.89  GLUCOSE 103*  CALCIUM 9.1   No results for input(s): LABPT, INR in the last 72 hours.  Neurologically intact Dorsiflexion/Plantar flexion intact   Assessment/Plan: 1 Day Post-Op Procedure(s) (LRB): COMPUTER ASSISTED TOTAL KNEE ARTHROPLASTY (Right) Up with therapy.Will DC today.      Dustin Brooks 05/13/2019, 8:15 AM

## 2019-05-13 NOTE — Plan of Care (Signed)
?  Problem: Clinical Measurements: ?Goal: Respiratory complications will improve ?Outcome: Progressing ?Goal: Cardiovascular complication will be avoided ?Outcome: Progressing ?  ?Problem: Activity: ?Goal: Risk for activity intolerance will decrease ?Outcome: Progressing ?  ?Problem: Pain Managment: ?Goal: General experience of comfort will improve ?Outcome: Progressing ?  ?Problem: Safety: ?Goal: Ability to remain free from injury will improve ?Outcome: Progressing ?  ?

## 2019-05-13 NOTE — Progress Notes (Signed)
   05/13/19 0520 05/13/19 0530  Oxygen Therapy  SpO2 (!) 84 % 99 %  O2 Device Room Air Room Air   SpO2 84% inaccurate reading. Rechecked SpO2 was 99% RA.
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
## 2019-05-13 NOTE — TOC Initial Note (Signed)
Transition of Care [facility]) - Initial/Assessment Note    Patient Details  Name: Dustin Brooks MRN: [id number] Date of Birth: 04/23/1981  Transition of Care (TOC) CM/SW Contact:    Joaquin Courts, RN Phone Number: 05/13/2019, 11:23 AM  Clinical Narrative:  CM spoke with patient at bedside. Patient plans to d/c home with OPPT, first appointment on Tuesday.  Patient declines 3-in-1.  [employer] to deliver rolling walker to bedside for home use.                   Expected Discharge Plan: Home/Self Care Barriers to Discharge: No Barriers Identified   Patient Goals and CMS Choice Patient states their goals for this hospitalization and ongoing recovery are:: to go home      Expected Discharge Plan and Services Expected Discharge Plan: Home/Self Care   Discharge Planning Services: CM Consult   Living arrangements for the past 2 months: Single Family Home                 DME Arranged: Walker rolling DME Agency: [employer] Date DME Agency Contacted: 05/13/19 Time DME Agency Contacted: [id number] Representative spoke with at DME Agency: [place]: NA [place] Agency: NA        Prior Living Arrangements/Services Living arrangements for the past 2 months: [place] with:: Self Patient language and need for interpreter reviewed:: Yes Do you feel safe going back to the place where you live?: Yes      Need for Family Participation in Patient Care: Yes (Comment) Care giver support system in place?: Yes (comment)   Criminal Activity/Legal Involvement Pertinent to Current Situation/Hospitalization: No - Comment as needed  Activities of Daily Living Home Assistive Devices/Equipment: Bedside commode/3-in-1, Walker (specify type), Wheelchair ADL Screening (condition at time of admission) Patient's cognitive ability adequate to safely complete daily activities?: Yes Is the patient deaf or have difficulty hearing?: No Does the patient have difficulty seeing, even  when wearing glasses/contacts?: No Does the patient have difficulty concentrating, remembering, or making decisions?: No Patient able to express need for assistance with ADLs?: Yes Does the patient have difficulty dressing or bathing?: No Independently performs ADLs?: Yes (appropriate for developmental age) Does the patient have difficulty walking or climbing stairs?: No Weakness of Legs: None Weakness of Arms/Hands: None  Permission Sought/Granted                  Emotional Assessment Appearance:: Appears stated age Attitude/Demeanor/Rapport: Engaged Affect (typically observed): Accepting Orientation: : Oriented to Self, Oriented to Place, Oriented to  Time, Oriented to Situation   Psych Involvement: No (comment)  Admission diagnosis:  Right knee osteoarthritis Patient Active Problem List   Diagnosis Date Noted  . Degenerative arthritis of right knee 05/12/2019  . 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

## 2019-05-15 ENCOUNTER — Encounter (HOSPITAL_COMMUNITY): Payer: Self-pay | Admitting: Orthopedic Surgery

## 2019-05-17 DIAGNOSIS — M25561 Pain in right knee: Secondary | ICD-10-CM | POA: Diagnosis not present

## 2019-05-22 DIAGNOSIS — Z96651 Presence of right artificial knee joint: Secondary | ICD-10-CM | POA: Diagnosis not present

## 2019-05-22 DIAGNOSIS — Z471 Aftercare following joint replacement surgery: Secondary | ICD-10-CM | POA: Diagnosis not present

## 2019-05-23 DIAGNOSIS — M25561 Pain in right knee: Secondary | ICD-10-CM | POA: Diagnosis not present

## 2019-05-25 DIAGNOSIS — M25561 Pain in right knee: Secondary | ICD-10-CM | POA: Diagnosis not present

## 2019-05-29 DIAGNOSIS — M25561 Pain in right knee: Secondary | ICD-10-CM | POA: Diagnosis not present

## 2019-05-31 DIAGNOSIS — M25561 Pain in right knee: Secondary | ICD-10-CM | POA: Diagnosis not present

## 2019-06-05 DIAGNOSIS — M25561 Pain in right knee: Secondary | ICD-10-CM | POA: Diagnosis not present

## 2019-06-12 DIAGNOSIS — M25561 Pain in right knee: Secondary | ICD-10-CM | POA: Diagnosis not present

## 2019-06-14 DIAGNOSIS — M25561 Pain in right knee: Secondary | ICD-10-CM | POA: Diagnosis not present

## 2019-07-04 DIAGNOSIS — M25561 Pain in right knee: Secondary | ICD-10-CM | POA: Diagnosis not present

## 2019-07-12 DIAGNOSIS — M25561 Pain in right knee: Secondary | ICD-10-CM | POA: Diagnosis not present

## 2019-07-18 DIAGNOSIS — M25561 Pain in right knee: Secondary | ICD-10-CM | POA: Diagnosis not present

## 2019-08-14 DIAGNOSIS — Z96651 Presence of right artificial knee joint: Secondary | ICD-10-CM | POA: Diagnosis not present

## 2019-08-14 DIAGNOSIS — Z471 Aftercare following joint replacement surgery: Secondary | ICD-10-CM | POA: Diagnosis not present

## 2019-08-29 DIAGNOSIS — M25561 Pain in right knee: Secondary | ICD-10-CM | POA: Diagnosis not present

## 2019-09-05 DIAGNOSIS — M25561 Pain in right knee: Secondary | ICD-10-CM | POA: Diagnosis not present

## 2021-12-24 ENCOUNTER — Ambulatory Visit
Admission: RE | Admit: 2021-12-24 | Discharge: 2021-12-24 | Disposition: A | Discharge status: Home / Self Care | Payer: No Typology Code available for payment source | Source: Ambulatory Visit | Attending: Physician Assistant | Admitting: Physician Assistant

## 2021-12-24 ENCOUNTER — Other Ambulatory Visit: Payer: Self-pay

## 2021-12-24 VITALS — BP 110/77 | HR 92 | Temp 98.4°F | Resp 18

## 2021-12-24 DIAGNOSIS — Z113 Encounter for screening for infections with a predominantly sexual mode of transmission: Secondary | ICD-10-CM | POA: Diagnosis not present

## 2021-12-24 NOTE — ED Triage Notes (Signed)
Pt c/o sex partner testing chlamydia(+) ~ 1 month ago. States partner and patient were treated at that time. Pt himself was never tested for STIs and wants to be pan tested for assurance.

## 2021-12-24 NOTE — ED Provider Notes (Signed)
[facility]    CSN: [id number] Arrival date & time: 12/24/21  0948      History   Chief Complaint Chief Complaint  Patient presents with   Body Fluid Exposure    HPI Dustin Brooks is a 41 y.o. male.   Patient here today for STD screening. His partner tested positive for chlamydia about a month ago and states that he and the partner were both treated at that time. Patient requests screening for assurance.   The history is provided by the patient.   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    Past Surgical History:  Procedure Laterality Date   BLADDER SURGERY     bladder rupture   EXTERNAL FIXATION REMOVAL Left 05/14/2016   Procedure: REMOVAL OF EXTERNAL FIXATOR;  Surgeon: Wylene Simmer, MD;  Location: [facility];  Service: Orthopedics;  Laterality: Left;   EXTERNAL FIXATOR APPLICATION Left [id number]   tib/fib   KNEE ARTHROPLASTY Right 05/12/2019   Procedure: COMPUTER ASSISTED TOTAL KNEE ARTHROPLASTY;  Surgeon: Rod Can, MD;  Location: [facility];  Service: Orthopedics;  Laterality: Right;  adductor canal   KNEE DISLOCATION SURGERY Right    OPEN REDUCTION INTERNAL FIXATION (ORIF) TIBIA/FIBULA FRACTURE Left 05/14/2016   Procedure: OPEN REDUCTION INTERNAL FIXATION LEFT FIBULA AND TIBIAL PILON FRACTURE (ORIF);  Surgeon: Wylene Simmer, MD;  Location: [place];  Service: Orthopedics;  Laterality: Left;   ORIF SHOULDER FRACTURE Left        Home Medications    Prior to Admission medications   Medication Sig Start Date End Date Taking? Authorizing Provider  Cyanocobalamin (VITAMIN B-12 PO) Take 1 tablet by mouth 3 (three) times a week.    [provider]   docusate sodium (COLACE) 100 MG capsule Take 1 capsule (100 mg total) by mouth 2 (two) times daily. 05/12/19   Swinteck, Aaron Edelman, MD  HYDROcodone-acetaminophen (NORCO) 5-325 MG tablet Take 1 tablet by mouth every 4 (four) hours as needed for moderate pain. 05/12/19   Swinteck, Aaron Edelman, MD  ondansetron (ZOFRAN) 4 MG tablet Take 1 tablet (4 mg total) by mouth every 8 (eight) hours as needed for nausea or vomiting. 05/12/19   Swinteck, Aaron Edelman, MD  senna (SENOKOT) 8.6 MG TABS tablet Take 2 tablets (17.2 mg total) by mouth at bedtime. 05/12/19   Rod Can, MD    Family History History reviewed. No pertinent family history.  Social History Social History   Tobacco Use   Smoking status: Every Day    Packs/day: 0.25    Years: 15.00    Pack years: 3.75    Types: Cigarettes   Smokeless tobacco: Never  Vaping Use   Vaping Use: Some days  Substance Use Topics   Alcohol use: Yes    Comment: occasionally   Drug use: No     Allergies   Patient has no known allergies.   Review of Systems Review of Systems  Constitutional:  Negative for chills and fever.  Eyes:  Negative for discharge and redness.  Genitourinary:  Negative for penile discharge.  Neurological:  Negative for numbness.    Physical Exam Triage Vital Signs ED Triage Vitals  Enc Vitals Group  BP      Pulse      Resp      Temp      Temp src      SpO2      Weight      Height      Head Circumference      Peak Flow      Pain Score      Pain Loc      Pain Edu?      Excl. in [place]?    No data found.  Updated Vital Signs BP 110/77 (BP Location: Left Arm)    Pulse 92    Temp 98.4 F (36.9 C) (Oral)    Resp 18    SpO2 98%      Physical Exam Vitals and nursing note reviewed.  Constitutional:      General: He is not in acute distress.    Appearance: Normal appearance. He is not ill-appearing.  HENT:     Head: Normocephalic and atraumatic.  Eyes:     Conjunctiva/sclera: Conjunctivae normal.  Cardiovascular:      Rate and Rhythm: Normal rate.  Pulmonary:     Effort: Pulmonary effort is normal.  Neurological:     Mental Status: He is alert.  Psychiatric:        Mood and Affect: Mood normal.        Behavior: Behavior normal.        Thought Content: Thought content normal.     UC Treatments / Results  Labs (all labs ordered are listed, but only abnormal results are displayed) Labs Reviewed  CYTOLOGY, (ORAL, ANAL, URETHRAL) ANCILLARY ONLY    EKG   Radiology No results found.  Procedures Procedures (including critical care time)  Medications Ordered in UC Medications - No data to display  Initial Impression / Assessment and Plan / UC Course  I have reviewed the triage vital signs and the nursing notes.  Pertinent labs & imaging results that were available during my care of the patient were reviewed by me and considered in my medical decision making (see chart for details).    STD screening ordered. Will await results for further recommendation. Encourage follow up with any further concerns.   Final Clinical Impressions(s) / UC Diagnoses   Final diagnoses:  Screening for STD (sexually transmitted disease)   Discharge Instructions   None    ED Prescriptions   None    PDMP not reviewed this encounter.   Francene Finders, PA-C 12/24/21 1015

## 2021-12-25 LAB — CYTOLOGY, (ORAL, ANAL, URETHRAL) ANCILLARY ONLY
Chlamydia: NEGATIVE
Comment: NEGATIVE
Comment: NEGATIVE
Comment: NORMAL
Neisseria Gonorrhea: NEGATIVE
Trichomonas: NEGATIVE

## 2022-06-11 ENCOUNTER — Ambulatory Visit: Payer: Self-pay

## 2022-06-11 NOTE — Telephone Encounter (Signed)
2nd attempt, pt called, LVMTCB to discuss symptoms.  

## 2022-06-11 NOTE — Telephone Encounter (Signed)
Pt called, LVMTCB on cell #. Pt called at work # per VM but pt wasn't currently there.  Summary: Possible rash on penis, no pain, no spreading   Pt called reporting some rash like symptoms on his genitals, no pain, no itching, not spreading.  Best contact: [phone]

## 2022-06-11 NOTE — Telephone Encounter (Signed)
3rd attempt, pt called, LVMTCB to discuss symptoms with pt.

## 2022-06-23 ENCOUNTER — Ambulatory Visit: Payer: Self-pay | Admitting: *Deleted

## 2022-06-23 NOTE — Telephone Encounter (Signed)
  Chief Complaint: multiple red areas noted to penis , uncircumcised.  Symptoms: red flat areas or possible rash noted to penis and after erection areas noted to be irritated. Patient is uncircumcised and denies drainage or poor hygiene.  Frequency: 6 months  Pertinent Negatives: Patient denies fever, no drainage, no pain . No itching Disposition: [] ED /[x] Urgent Care (no appt availability in office) / [] Appointment(In office/virtual)/ []  [facility]/ [] Home Care/ [] Refused Recommended Disposition /[] [facility]/ []  Follow-up with PCP Additional Notes:   No PCP. Recommended UC /ED for evaluation.    Reason for Disposition  Patient is worried they have a sexually transmitted infection (STI)  Answer Assessment - Initial Assessment Questions 1. SYMPTOM: "What's the main symptom you're concerned about?" (e.g., discharge from penis, rash, pain, itching, swelling)     Rash , flat and after intercourse gets worse  2. LOCATION: "Where is the rash  located?"     Penis . Uncircumcised and reports multiple red spots to penis  3. ONSET: "When did sx  start?"     Noted 6 months ago  4. PAIN: "Is there any pain?" If Yes, ask: "How bad is it?"  (Scale 1-10; or mild, moderate, severe)     No  5. URINE: "Any difficulty passing urine?" If Yes, ask: "When was the last time?"     na 6. CAUSE: "What do you think is causing the symptoms?"     Not sure , GF tested negative for STIs 7. OTHER SYMPTOMS: "Do you have any other symptoms?" (e.g., fever, abdomen pain, blood in urine)     When patient has erection, red areas appear more irritated .  Protocols used: Penis and Scrotum Symptoms-A-AH

## 2023-07-22 ENCOUNTER — Ambulatory Visit
Admission: EM | Admit: 2023-07-22 | Discharge: 2023-07-22 | Discharge status: Against Medical Advice | Payer: No Typology Code available for payment source

## 2023-07-23 ENCOUNTER — Ambulatory Visit
Admission: EM | Admit: 2023-07-23 | Discharge: 2023-07-23 | Disposition: A | Discharge status: Home / Self Care | Payer: Self-pay | Attending: Internal Medicine | Admitting: Internal Medicine

## 2023-07-23 DIAGNOSIS — Z113 Encounter for screening for infections with a predominantly sexual mode of transmission: Secondary | ICD-10-CM | POA: Insufficient documentation

## 2023-07-23 NOTE — Discharge Instructions (Signed)
STD testing is pending.  We will call if anything is positive.  Please refrain from sexual activity until test results are complete.
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
## 2023-07-24 LAB — HIV ANTIBODY (ROUTINE TESTING W REFLEX): HIV Screen 4th Generation wRfx: NONREACTIVE

## 2023-07-24 LAB — RPR: RPR Ser Ql: NONREACTIVE

## 2023-07-26 LAB — CYTOLOGY, (ORAL, ANAL, URETHRAL) ANCILLARY ONLY
Chlamydia: NEGATIVE
Comment: NEGATIVE
Comment: NEGATIVE
Comment: NORMAL
Neisseria Gonorrhea: NEGATIVE
Trichomonas: NEGATIVE
